# Patient Record
Sex: Male | Born: 1985
Health system: Southern US, Community
[De-identification: ages and names within clinical notes are randomized; demographics above are authoritative.]

## PROBLEM LIST (undated history)

## (undated) DIAGNOSIS — Q256 Stenosis of pulmonary artery: Secondary | ICD-10-CM

## (undated) DIAGNOSIS — Z9289 Personal history of other medical treatment: Secondary | ICD-10-CM

## (undated) DIAGNOSIS — F840 Autistic disorder: Secondary | ICD-10-CM

## (undated) DIAGNOSIS — F79 Unspecified intellectual disabilities: Secondary | ICD-10-CM

## (undated) DIAGNOSIS — Q211 Atrial septal defect, unspecified: Secondary | ICD-10-CM

## (undated) HISTORY — PX: CARDIAC SURGERY: SHX584

## (undated) HISTORY — DX: Personal history of other medical treatment: Z92.89

## (undated) HISTORY — PX: TONSILLECTOMY: SUR1361

---
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
## 2007-01-02 DIAGNOSIS — L309 Dermatitis, unspecified: Secondary | ICD-10-CM

## 2007-01-02 DIAGNOSIS — H409 Unspecified glaucoma: Secondary | ICD-10-CM | POA: Insufficient documentation

## 2007-01-02 DIAGNOSIS — L301 Dyshidrosis [pompholyx]: Secondary | ICD-10-CM | POA: Insufficient documentation

## 2007-01-31 ENCOUNTER — Ambulatory Visit (HOSPITAL_BASED_OUTPATIENT_CLINIC_OR_DEPARTMENT_OTHER): Admission: RE | Admit: 2007-01-31 | Discharge: 2007-01-31 | Payer: Self-pay | Admitting: Ophthalmology

## 2007-02-04 ENCOUNTER — Encounter (INDEPENDENT_AMBULATORY_CARE_PROVIDER_SITE_OTHER): Payer: Self-pay | Admitting: *Deleted

## 2007-02-04 DIAGNOSIS — Q225 Ebstein's anomaly: Secondary | ICD-10-CM

## 2007-02-12 ENCOUNTER — Encounter (INDEPENDENT_AMBULATORY_CARE_PROVIDER_SITE_OTHER): Payer: Self-pay | Admitting: *Deleted

## 2007-05-15 ENCOUNTER — Telehealth: Payer: Self-pay | Admitting: *Deleted

## 2007-05-16 ENCOUNTER — Ambulatory Visit (HOSPITAL_BASED_OUTPATIENT_CLINIC_OR_DEPARTMENT_OTHER): Admission: RE | Admit: 2007-05-16 | Discharge: 2007-05-16 | Payer: Self-pay | Admitting: Ophthalmology

## 2007-06-12 ENCOUNTER — Telehealth (INDEPENDENT_AMBULATORY_CARE_PROVIDER_SITE_OTHER): Payer: Self-pay | Admitting: *Deleted

## 2007-06-12 ENCOUNTER — Encounter: Admission: RE | Admit: 2007-06-12 | Discharge: 2007-06-12 | Payer: Self-pay | Admitting: Sports Medicine

## 2007-06-12 ENCOUNTER — Ambulatory Visit: Payer: Self-pay | Admitting: Family Medicine

## 2007-07-09 ENCOUNTER — Ambulatory Visit: Payer: Self-pay | Admitting: Family Medicine

## 2007-07-09 DIAGNOSIS — H5069 Other mechanical strabismus: Secondary | ICD-10-CM

## 2007-07-09 DIAGNOSIS — L708 Other acne: Secondary | ICD-10-CM | POA: Insufficient documentation

## 2007-07-09 DIAGNOSIS — L732 Hidradenitis suppurativa: Secondary | ICD-10-CM | POA: Insufficient documentation

## 2008-03-09 ENCOUNTER — Emergency Department (HOSPITAL_COMMUNITY): Admission: EM | Admit: 2008-03-09 | Discharge: 2008-03-09 | Payer: Self-pay | Admitting: Emergency Medicine

## 2008-04-29 ENCOUNTER — Ambulatory Visit: Payer: Self-pay | Admitting: Family Medicine

## 2008-04-29 ENCOUNTER — Telehealth: Payer: Self-pay | Admitting: *Deleted

## 2008-07-26 ENCOUNTER — Emergency Department (HOSPITAL_COMMUNITY): Admission: EM | Admit: 2008-07-26 | Discharge: 2008-07-26 | Payer: Self-pay | Admitting: Family Medicine

## 2008-07-26 ENCOUNTER — Telehealth: Payer: Self-pay | Admitting: *Deleted

## 2008-07-28 ENCOUNTER — Ambulatory Visit: Payer: Self-pay | Admitting: Family Medicine

## 2008-09-21 ENCOUNTER — Encounter: Payer: Self-pay | Admitting: *Deleted

## 2008-10-11 ENCOUNTER — Ambulatory Visit: Payer: Self-pay | Admitting: Family Medicine

## 2009-07-15 ENCOUNTER — Ambulatory Visit: Payer: Self-pay | Admitting: Family Medicine

## 2009-07-15 DIAGNOSIS — F840 Autistic disorder: Secondary | ICD-10-CM | POA: Insufficient documentation

## 2009-07-21 ENCOUNTER — Encounter: Payer: Self-pay | Admitting: Family Medicine

## 2010-07-07 ENCOUNTER — Ambulatory Visit: Payer: Self-pay | Admitting: Cardiovascular Disease

## 2010-07-07 ENCOUNTER — Ambulatory Visit (HOSPITAL_COMMUNITY): Admission: RE | Admit: 2010-07-07 | Discharge: 2010-07-07 | Payer: Self-pay | Admitting: Cardiovascular Disease

## 2010-09-15 ENCOUNTER — Encounter: Payer: Self-pay | Admitting: Family Medicine

## 2010-12-05 NOTE — Miscellaneous (Signed)
   Clinical Lists Changes  Problems: Removed problem of ONYCHOMYCOSIS, TOENAILS (ICD-110.1) Removed problem of SKIN RASH (ICD-782.1) Removed problem of PULMONARY EDEMA ACUTE NOS (ICD-518.4) Removed problem of ACNE (ICD-706.1)

## 2010-12-07 ENCOUNTER — Encounter: Payer: Self-pay | Admitting: Family Medicine

## 2010-12-07 ENCOUNTER — Emergency Department (HOSPITAL_COMMUNITY): Payer: Medicaid Other

## 2010-12-07 ENCOUNTER — Ambulatory Visit (INDEPENDENT_AMBULATORY_CARE_PROVIDER_SITE_OTHER): Payer: Medicaid Other | Admitting: Family Medicine

## 2010-12-07 ENCOUNTER — Emergency Department (HOSPITAL_COMMUNITY)
Admission: EM | Admit: 2010-12-07 | Discharge: 2010-12-08 | Disposition: A | Discharge status: Nursing Home (Includes ICF) | Payer: Medicaid Other | Source: Home / Self Care | Attending: Emergency Medicine | Admitting: Emergency Medicine

## 2010-12-07 DIAGNOSIS — R112 Nausea with vomiting, unspecified: Secondary | ICD-10-CM | POA: Insufficient documentation

## 2010-12-07 DIAGNOSIS — R1110 Vomiting, unspecified: Secondary | ICD-10-CM

## 2010-12-07 DIAGNOSIS — N390 Urinary tract infection, site not specified: Secondary | ICD-10-CM

## 2010-12-07 DIAGNOSIS — F840 Autistic disorder: Secondary | ICD-10-CM

## 2010-12-07 DIAGNOSIS — R319 Hematuria, unspecified: Secondary | ICD-10-CM | POA: Insufficient documentation

## 2010-12-07 LAB — CONVERTED CEMR LAB
Glucose, Urine, Semiquant: NEGATIVE
Nitrite: POSITIVE
Protein, U semiquant: 30
Urobilinogen, UA: 2
pH: 6.5

## 2010-12-07 LAB — URINALYSIS, ROUTINE W REFLEX MICROSCOPIC
Hgb urine dipstick: NEGATIVE
Ketones, ur: 40 mg/dL — AB
Protein, ur: NEGATIVE mg/dL
Urine Glucose, Fasting: NEGATIVE mg/dL

## 2010-12-07 LAB — COMPREHENSIVE METABOLIC PANEL
ALT: 16 U/L (ref 0–53)
Albumin: 4.1 g/dL (ref 3.5–5.2)
Alkaline Phosphatase: 59 U/L (ref 39–117)
Potassium: 4 mEq/L (ref 3.5–5.1)
Sodium: 136 mEq/L (ref 135–145)
Total Protein: 7.7 g/dL (ref 6.0–8.3)

## 2010-12-07 LAB — CBC
HCT: 44 % (ref 39.0–52.0)
MCHC: 34.3 g/dL (ref 30.0–36.0)
MCV: 81.2 fL (ref 78.0–100.0)
RDW: 14 % (ref 11.5–15.5)

## 2010-12-07 LAB — DIFFERENTIAL
Basophils Absolute: 0 10*3/uL (ref 0.0–0.1)
Basophils Relative: 0 % (ref 0–1)
Eosinophils Absolute: 0 10*3/uL (ref 0.0–0.7)
Eosinophils Relative: 0 % (ref 0–5)
Lymphocytes Relative: 6 % — ABNORMAL LOW (ref 12–46)
Lymphs Abs: 0.6 10*3/uL — ABNORMAL LOW (ref 0.7–4.0)
Monocytes Absolute: 0.8 10*3/uL (ref 0.1–1.0)
Monocytes Relative: 7 % (ref 3–12)
Neutro Abs: 9.3 10*3/uL — ABNORMAL HIGH (ref 1.7–7.7)
Neutrophils Relative %: 87 % — ABNORMAL HIGH (ref 43–77)

## 2010-12-07 LAB — PROTIME-INR: INR: 1.35 (ref 0.00–1.49)

## 2010-12-07 LAB — ABO/RH: ABO/RH(D): O POS

## 2010-12-07 LAB — TYPE AND SCREEN

## 2010-12-07 MED ORDER — IOHEXOL 300 MG/ML  SOLN
100.0000 mL | Freq: Once | INTRAMUSCULAR | Status: AC | PRN
  Administered 2010-12-07: 100 mL via INTRAVENOUS

## 2010-12-08 ENCOUNTER — Observation Stay (HOSPITAL_COMMUNITY)
Admission: EM | Admit: 2010-12-08 | Discharge: 2010-12-09 | Disposition: A | Discharge status: Home / Self Care | Payer: Medicaid Other | Source: Other Acute Inpatient Hospital | Attending: Family Medicine | Admitting: Family Medicine

## 2010-12-08 ENCOUNTER — Encounter: Payer: Self-pay | Admitting: Family Medicine

## 2010-12-08 DIAGNOSIS — F840 Autistic disorder: Secondary | ICD-10-CM | POA: Insufficient documentation

## 2010-12-08 DIAGNOSIS — N390 Urinary tract infection, site not specified: Principal | ICD-10-CM | POA: Insufficient documentation

## 2010-12-08 DIAGNOSIS — E860 Dehydration: Secondary | ICD-10-CM | POA: Insufficient documentation

## 2010-12-08 DIAGNOSIS — Z23 Encounter for immunization: Secondary | ICD-10-CM | POA: Insufficient documentation

## 2010-12-08 DIAGNOSIS — R109 Unspecified abdominal pain: Secondary | ICD-10-CM | POA: Insufficient documentation

## 2010-12-08 DIAGNOSIS — I379 Nonrheumatic pulmonary valve disorder, unspecified: Secondary | ICD-10-CM | POA: Insufficient documentation

## 2010-12-08 DIAGNOSIS — Q225 Ebstein's anomaly: Secondary | ICD-10-CM | POA: Insufficient documentation

## 2010-12-08 DIAGNOSIS — K920 Hematemesis: Secondary | ICD-10-CM | POA: Insufficient documentation

## 2010-12-08 DIAGNOSIS — R339 Retention of urine, unspecified: Secondary | ICD-10-CM | POA: Insufficient documentation

## 2010-12-08 LAB — CBC
HCT: 44 % (ref 39.0–52.0)
Hemoglobin: 14.6 g/dL (ref 13.0–17.0)
MCH: 26.8 pg (ref 26.0–34.0)
MCHC: 33.2 g/dL (ref 30.0–36.0)
MCV: 80.7 fL (ref 78.0–100.0)
Platelets: 180 10*3/uL (ref 150–400)
RBC: 5.45 MIL/uL (ref 4.22–5.81)
RDW: 14.2 % (ref 11.5–15.5)
WBC: 7.3 10*3/uL (ref 4.0–10.5)

## 2010-12-08 LAB — BASIC METABOLIC PANEL
BUN: 9 mg/dL (ref 6–23)
CO2: 27 mEq/L (ref 19–32)
Calcium: 9 mg/dL (ref 8.4–10.5)
Chloride: 103 mEq/L (ref 96–112)
Creatinine, Ser: 1.17 mg/dL (ref 0.4–1.5)
GFR calc Af Amer: >60 mL/min (ref >60)
GFR calc non Af Amer: >60 mL/min (ref >60)
Glucose, Bld: 87 mg/dL (ref 70–99)
Potassium: 3.9 mEq/L (ref 3.5–5.1)
Sodium: 140 mEq/L (ref 135–145)

## 2010-12-08 LAB — BILIRUBIN, TOTAL: Total Bilirubin: 1.8 mg/dL — ABNORMAL HIGH (ref 0.3–1.2)

## 2010-12-10 LAB — URINE CULTURE: Culture  Setup Time: 201202030512

## 2010-12-11 ENCOUNTER — Telehealth (INDEPENDENT_AMBULATORY_CARE_PROVIDER_SITE_OTHER): Payer: Self-pay | Admitting: *Deleted

## 2010-12-13 ENCOUNTER — Telehealth: Payer: Self-pay | Admitting: Family Medicine

## 2010-12-13 NOTE — Assessment & Plan Note (Signed)
Summary: UTI, N/V and Diarrhea   Vital Signs:  Patient profile:   25 year old male Height:      70 inches Weight:      221 pounds BMI:     31.82 Temp:     97.9 degrees F oral Pulse rate:   96 / minute BP sitting:   128 / 77  (left arm) Cuff size:   regular  Vitals Entered By: Tessie Fass CMA (December 07, 2010 11:29 AM) CC: abdominal pain, nausea, vomiting and diarrhea x this am Pain Assessment Patient in pain? yes     Location: abdomen   Primary Care Provider:  Angelena Sole MD  CC:  abdominal pain, nausea, and vomiting and diarrhea x this am.  History of Present Illness: Nausea vomiting and abd pain 25 y/o autistic male who comes in with his mother. He has been having some abdominal pain, diarrhea and vomiting since 9:30 am today. He has some chills but no fever. His mom says he was crying with pain this morning which is unusual for him. He says pain is in his head and abdomen. He says he had blood coming from his penis when he urinated yesterday. He had not told his mom this before the visit.   Allergies (verified): No Known Drug Allergies  Review of Systems        vitals reviewed and pertinent negatives and positives seen in HPI   Physical Exam  General:  autistic but alert and well-developed, well-nourished, and well-hydrated.   Head:  Normocephalic and atraumatic without obvious abnormalities. No apparent alopecia or balding. Abdomen:  pt has tenderness in his epigastric region.  Genitalia:  pt has dried blood at the opening of the penis. no rahses or lesions.  Psych:  interactive but anxious and worried that he was going to get a shot.    Impression & Recommendations:  Problem # 1:  UTI (ICD-599.0) Assessment New Pt has a UTI based on UA. He has never had a UTI in the past per mom. He saw blood in his urine today. Plan to treat with Bactrim.   His updated medication list for this problem includes:    Bactrim Ds 800-160 Mg Tabs  (Sulfamethoxazole-trimethoprim) .Marland Kitchen... Take 1 pill every 12 hours for the next 7 days.  Orders: [facility]- Est Level  3 (46962)  Problem # 2:  HEMATURIA UNSPECIFIED (ICD-599.70) Assessment: New Due to the UTI as noted above.   His updated medication list for this problem includes:    Bactrim Ds 800-160 Mg Tabs (Sulfamethoxazole-trimethoprim) .Marland Kitchen... Take 1 pill every 12 hours for the next 7 days.  Orders: Urinalysis-[facility] (00000) [facility]- Est Level  3 (95284)  Problem # 3:  NAUSEA AND VOMITING (ICD-787.01) Assessment: New Pt has N/V/D and was given Rx for Zofran to help with the vomiting should it return. It may be all related to his UTI but pt's mom given precautions about bringing him back or going to ED if he worsens.   Orders: [facility]- Est Level  3 (13244)  Complete Medication List: 1)  Bactrim Ds 800-160 Mg Tabs (Sulfamethoxazole-trimethoprim) .... Take 1 pill every 12 hours for the next 7 days. 2)  Zofran Odt 4 Mg Tbdp (Ondansetron) .... Take 1 tablet every 4-6 hours as needed for nauea  Patient Instructions: 1)  You have a UTI.  2)  STart taking the antibiotics today.  3)  Sip on Gatorade all day today.  4)  I am sending in a nausea medicine.  Let it dissolve under the tongue when you feel nasueated 5)  If you have worsening diarrhea or vomiting please go to the ER before you get dehydrated.  Prescriptions: ZOFRAN ODT 4 MG TBDP (ONDANSETRON) take 1 tablet every 4-6 hours as needed for nauea  #6 x 0   Entered and Authorized by:   Jamie Brookes MD   Signed by:   Jamie Brookes MD on 12/07/2010   Method used:   Electronically to        [facility]  [address] [id number] ([facility])       [address]       [place], [place]  [address]       Ph: [phone] or [phone]       Fax: [phone]   RxID:   [id number] BACTRIM DS 800-160 MG TABS (SULFAMETHOXAZOLE-TRIMETHOPRIM) take 1 pill every 12 hours for the next 7 days.  #14 x 0   Entered and Authorized by:   Jamie Brookes  MD   Signed by:   Jamie Brookes MD on 12/07/2010   Method used:   [facility] to        [facility]  [address] [id number]       [address]       [place], [place]  [address]       Ph: [phone] or [phone]       Fax: [phone]   RxID:   [id number]    Orders Added: 1)  Urinalysis-[facility] [00000] 2)  [facility]- Est Level  3 [99213]    Laboratory Results   Urine Tests  Date/Time Received: December 07, 2010 11:49 AM  Date/Time Reported: December 07, 2010 12:08 PM   Routine Urinalysis   Color: yellow Appearance: Cloudy Glucose: negative   (Normal Range: Negative) Bilirubin: negative   (Normal Range: Negative) Ketone: large (80)   (Normal Range: Negative) Spec. Gravity: 1.015   (Normal Range: 1.003-1.035) Blood: large   (Normal Range: Negative) pH: 6.5   (Normal Range: 5.0-8.0) Protein: 30   (Normal Range: Negative) Urobilinogen: 2.0   (Normal Range: 0-1) Nitrite: positive   (Normal Range: Negative) Leukocyte Esterace: moderate   (Normal Range: Negative)  Urine Microscopic WBC/HPF: loaded RBC/HPF: loaded Bacteria/HPF: 3+ Epithelial/HPF: 1-5 Casts/LPF: occ granular    Comments: ...............test performed by......Marland KitchenBonnie A. [place], MLS (ASCP)cm

## 2010-12-13 NOTE — Initial Assessments (Signed)
Summary: Hemetemesis, Abd pain       Allergies Added: NKDA Clinical Lists Changes  Observations: Added new observation of EXTREMITIES: No clubbing, cyanosis, edema, or deformity noted with normal full range of motion of all joints.   (12/08/2010 2:56) Added new observation of PEDAL PULSE: R and L carotid,radial,femoral,dorsalis pedis and posterior tibial pulses are full and equal bilaterally (12/08/2010 2:56) Added new observation of ABDOMEN EXAM: Bowel sounds positive,abdomen soft and non-tender without masses, organomegaly or hernias noted. (12/08/2010 2:56) Added new observation of LUNG EXAM: Normal respiratory effort, chest expands symmetrically. Lungs are clear to auscultation, no crackles or wheezes. (12/08/2010 2:56) Added new observation of HEART EXAM: Regular rate and rhythm. 2/6 SEM heard best at LLSB.  No rub or gallop.  2+ Dorsalis Pedis pulses.  (12/08/2010 2:56) Added new observation of ORAL EXAM: MMM (12/08/2010 2:56) Added new observation of EYE EXAM: EOMI  (12/08/2010 2:56) Added new observation of HD/FACE INSP: normocephalic and atraumatic.   (12/08/2010 2:56) Added new observation of ROS: GU: Complains of hematuria; Denies dysuria, urinary frequency (12/08/2010 2:56) Added new observation of ROS: GI: Complains of abdominal pain, diarrhea, nausea, vomiting, vomiting blood (12/08/2010 2:56) Added new observation of ZOX:WRUEAV: Denies cough, shortness of breath (12/08/2010 2:56) Added new observation of WUJ:WJXBJYN: Complains of chills, fever (12/08/2010 2:56) Added new observation of PE COMMENTS: 1.  CBC 10.7> 15.1/44.0 < 186, ANC 9.3 2.  Cmet Na 136, K 4.0, Cl 100, CO2 27, CGluc 86, Bun 12, Cr 1.18, TB 2.0, Alp 59, AST 20, ALT 16, TP 7.7, Alb 4.1, Ca 9.3  3.  UA spec grav >1.046, 40 ketones, Nitrite neg, Leuk neg 4.  CT abd/pelvis: Negative.  No acute findings or other significant abnormality. 5.  UA from [facility] 11/06/10  Color: yellow Appearance: Cloudy Glucose: negative    (Normal Range: Negative) Bilirubin: negative   (Normal Range: Negative) Ketone: large (80)   (Normal Range: Negative) Spec. Gravity: 1.015   (Normal Range: 1.003-1.035) Blood: large   (Normal Range: Negative) pH: 6.5   (Normal Range: 5.0-8.0) Protein: 30   (Normal Range: Negative) Urobilinogen: 2.0   (Normal Range: 0-1) Nitrite: positive   (Normal Range: Negative) Leukocyte Esterace: moderate   (Normal Range: Negative)  Urine Microscopic  WBC/HPF: loaded RBC/HPF: loaded Bacteria/HPF: 3+ (12/08/2010 2:56) Added new observation of MEDRECON: current updated (12/08/2010 2:56) Added new observation of NKA: T (12/08/2010 2:56) Added new observation of ALLERGY REV: Done (12/08/2010 2:56) Added new observation of PEADULT: Mallory Schaad MD ~General`Gen appear ~Additional Exam`PE comments ~Head`hd/face insp ~Eyes`Eye exam ~Mouth`Oral exam ~Heart`Heart exam ~Lungs`lung exam ~Abdomen`Abdomen exam ~Pulses`pedal pulse ~Extremities`Extremities (12/08/2010 2:56) Added new observation of GEN APPEAR: Sleeping, arousable, but falls back asleep. (12/08/2010 2:56) Added new observation of BP DIASTOLIC: 72 mmHg (12/08/2010 8:29) Added new observation of BP SYSTOLIC: 117 mmHg (12/08/2010 2:56) Added new observation of PULSE RATE: 83 /min (12/08/2010 2:56) Added new observation of OXYGEN FLOW: Room air L/min (12/08/2010 2:56) Added new observation of O2SAT(OXIM): 95 % (12/08/2010 2:56) Added new observation of RESP RATE: 20 /min (12/08/2010 2:56) Added new observation of TEMPERATURE: 98.7 deg F (12/08/2010 2:56) Added new observation of HPI: 25 y/o autistic male who was brought to ER by his mother for hematemesis and abdomal pain.  Yesterday he was seen at the [facility] for abd pain, diarrhea and vomiting x1 day and was found to have a UTI that they treated with Bactrim.  Around 4pm yesterday he took the Bactrim and Zofran.  At 5pm he began  to have hematemesis and EMS was called to bring pt to [facility] ER.  When he arrived in  the ED he was febrile with temp 101.3.  He had a urinalysis that was negative for nitrites and leukocytes and also a negative abd/pelvis CT.  I was able to confirm UA from [facility] with EDP and pt was subsequently given Rocephin 1gram IV  and ibuprofen.  He also received a total of 1L NS in the ED.  Pt was then transferred to [facility] for admission.   Mom reports that pt has not urinated sine 3pm on 12/07/10.  He was not able to void in the ED and the urine specimen was obtained by catheter.  She states that he still complains of abd pain, but that he has been able to fall asleep.  Pt has also not vomitted since being in the ED.  Diarrhea has improved and pt has not had a BM since earlier yesterday.    (12/08/2010 2:56) Added new observation of CHIEF CMPLNT: Hematemesis, Abd pain  (12/08/2010 2:56) Added new observation of PRIMARY MD: Angelena Sole MD  (12/08/2010 2:56)      Primary Care Provider:  Angelena Sole MD  CC:  Hematemesis and Abd pain.  History of Present Illness: 25 y/o autistic male who was brought to ER by his mother for hematemesis and abdomal pain.  Yesterday he was seen at the [facility] for abd pain, diarrhea and vomiting x1 day and was found to have a UTI that they treated with Bactrim.  Around 4pm yesterday he took the Bactrim and Zofran.  At 5pm he began to have hematemesis and EMS was called to bring pt to [facility] ER.  When he arrived in the ED he was febrile with temp 101.3.  He had a urinalysis that was negative for nitrites and leukocytes and also a negative abd/pelvis CT.  I was able to confirm UA from [facility] with EDP and pt was subsequently given Rocephin 1gram IV  and ibuprofen.  He also received a total of 1L NS in the ED.  Pt was then transferred to [facility] for admission.   Mom reports that pt has not urinated sine 3pm on 12/07/10.  He was not able to void in the ED and the urine specimen was obtained by catheter.  She states that he still complains of abd pain, but that he has been able to  fall asleep.  Pt has also not vomitted since being in the ED.  Diarrhea has improved and pt has not had a BM since earlier yesterday.     Past History:  Past Medical History: Last updated: 07/09/2007 ? 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 Reyes Ivan (cardiologist)  Past Surgical History: Last updated: 07/09/2007 Strabismus surgery in 3/08 and 5/08  Family History: Last updated: 01/02/2007 MGF with Alzheimer`s in 2s., Mother and father in good health., No CAD, DM, or cancers.  Social History: Last updated: 07/09/2007 Looking to go to [employer].  Does well in school, high functioning autism.  No Etoh, drugs, tobacco.  Denies sexual intercourse. Likes typing class and movies and music.  Plans to write his own movie.  Very interested in girls.   Review of Systems General:  Complains of chills and fever. Resp:  Denies cough and shortness of breath. GI:  Complains of abdominal pain, diarrhea, nausea, vomiting, and vomiting blood. GU:  Complains of hematuria; denies dysuria and urinary  frequency.   Vital Signs:  Patient profile:   25 year old male O2 Sat:      95 % on Room air Temp:     98.7 degrees F Pulse rate:   83 / minute Resp:     20 per minute BP sitting:   117 / 72  O2 Flow:  Room air   Physical Exam  General:  Sleeping, arousable, but falls back asleep. Head:  normocephalic and atraumatic.   Eyes:  EOMI  Mouth:  MMM Lungs:  Normal respiratory effort, chest expands symmetrically. Lungs are clear to auscultation, no crackles or wheezes. Heart:  Regular rate and rhythm. 2/6 SEM heard best at LLSB.  No rub or gallop.  2+ Dorsalis Pedis pulses.  Abdomen:  Bowel sounds positive,abdomen soft and non-tender without masses, organomegaly or hernias noted. Pulses:  R and L carotid,radial,femoral,dorsalis pedis and posterior tibial pulses are full and equal bilaterally Extremities:  No  clubbing, cyanosis, edema, or deformity noted with normal full range of motion of all joints.   Additional Exam:  1.  CBC 10.7> 15.1/44.0 < 186, ANC 9.3 2.  Cmet Na 136, K 4.0, Cl 100, CO2 27, CGluc 86, Bun 12, Cr 1.18, TB 2.0, Alp 59, AST 20, ALT 16, TP 7.7, Alb 4.1, Ca 9.3  3.  UA spec grav >1.046, 40 ketones, Nitrite neg, Leuk neg 4.  CT abd/pelvis: Negative.  No acute findings or other significant abnormality. 5.  UA from [facility] 11/06/10  Color: yellow Appearance: Cloudy Glucose: negative   (Normal Range: Negative) Bilirubin: negative   (Normal Range: Negative) Ketone: large (80)   (Normal Range: Negative) Spec. Gravity: 1.015   (Normal Range: 1.003-1.035) Blood: large   (Normal Range: Negative) pH: 6.5   (Normal Range: 5.0-8.0) Protein: 30   (Normal Range: Negative) Urobilinogen: 2.0   (Normal Range: 0-1) Nitrite: positive   (Normal Range: Negative) Leukocyte Esterace: moderate   (Normal Range: Negative)  Urine Microscopic  WBC/HPF: loaded RBC/HPF: loaded Bacteria/HPF: 3+   Impression & Recommendations:  Problem # 1:  Pyelonephritis Pt presented with fever, abd pain, vomiting with earlier diagnosis of UTI.  Pt was Rx Bactrim yesterday and was brought to ER after hemetemesis at home.  He was given Rocephin x 1 in ED.  He has not been proven to fail outpatient treatment, but given that he was very dehydrated on UA from [facility] with spec grav of >1.046, he would benefit from continued IVF and IV antibiotic.  Pt is currently sleeping at this time so I cannot assess whether he can tolerate by mouth yet.  We will let him have a regular diet in AM as we continue with fluid hydration.  When pt can tolerate by mouth, we will transition back to Bactrim to finish the course for Pyelo treatment.   Problem # 2:  Urinary retention Resolved.  During this exam pt was able to void without difficuly.  Mom did not notice hematuria.   Problem # 3:  Hematemesis Hb stable.  Likely from aggravation of  earlier vomiting.  Will continue to monitor.   Problem # 4:  Valvular Pulmonary Stenosis Mom is requesting that [facility], pt's cardiologist, be notified that pt is hospitalized.  She is not asking for a formal consultation, but states that in case they feel they need to see him.    Problem # 5:  Prophylaxis SCDs  Problem # 6:  FEN/GI Regular diet.  NS @ 125 cc/hr  Problem # 7:  Disposition Clinical improvement.  Likely later today.   Complete Medication List: 1)  Bactrim Ds 800-160 Mg Tabs (Sulfamethoxazole-trimethoprim) .... Take 1 pill every 12 hours for the next 7 days. 2)  Zofran Odt 4 Mg Tbdp (Ondansetron) .... Take 1 tablet every 4-6 hours as needed for nauea    Current Medications (verified): 1)  Bactrim Ds 800-160 Mg Tabs (Sulfamethoxazole-Trimethoprim) .... Take 1 Pill Every 12 Hours For The Next 7 Days. 2)  Zofran Odt 4 Mg Tbdp (Ondansetron) .... Take 1 Tablet Every 4-6 Hours As Needed For Nauea  Allergies (verified): No Known Drug Allergies

## 2010-12-13 NOTE — Telephone Encounter (Addendum)
Message copied by Loralee Pacas on Wed Dec 13, 2010  5:41 PM ------      Message from: [facility], [place]      Created: Wed Dec 13, 2010  3:29 PM      Regarding: Levoquin for patient       I recently discharged this patient from the hospital and there have been some problems with him getting his levoquin.  I believe he has gotten it but am getting confusing messages.  If you could possibly call and verify this that would be wonderful!  I have tried and so far am getting an answering machine.            Thanks!  Hope you all are surviving!  I called and spoke with pt's mom he did get the meds

## 2010-12-18 NOTE — H&P (Signed)
NAME:  [name], [name] NO.:  [id number]  MEDICAL RECORD NO.:  [id number]           PATIENT TYPE:  LOCATION:                                 FACILITY:  PHYSICIAN:  Leighton Roach Azlaan Isidore, M.D.DATE OF BIRTH:  [birth year]  DATE OF ADMISSION:  12/08/2010 DATE OF DISCHARGE:                             HISTORY & PHYSICAL   PRIMARY CARE PHYSICIAN:  Angelena Sole, MD, at the [facility].  CHIEF COMPLAINT:  Hematemesis and abdominal pain.  HISTORY OF PRESENT ILLNESS:  This is a 25 year old autistic male who was brought to the ER by his mother by way of EMS for hematemesis and abdominal pain.  Yesterday on December 07, 2010, the patient was seen at the [facility] for abdominal pain, diarrhea, and vomiting x1 day.  The patient was found to have a urinary tract infection and was treated with Bactrim.  Around 4 p.m. yesterday, the patient to the Bactrim and Zofran.  At 5 p.m., he began to have vomiting that mom saw with blood present and EMS was called to bring patient to the ER.  The patient arrived at the [facility] ER and was found to be febrile with a temperature of 101.3.  He had a urinalysis at [facility] that was negative for nitrites and leukocyte, but and also a negative CT of the abdomen and pelvis.  I was able to confirm the urinalysis from the [facility] with the EDP in [place], and patient was subsequently given Rocephin IV x1 and ibuprofen for fever.  He also received a total of 1 liter of normal saline in the ED.  The patient was then transferred to [facility] for admission.  Mom reports that patient has not urinated since 3 p.m. on December 07, 2010.  He was not able to void in the ED and the urine specimen was obtained  by catheter.  She states that he still complains of abdominal pain, but that he has been able to fall asleep.  The patient has also not vomited since being in the ER.  Diarrhea has improved  and patient has not had a bowel movement since yesterday.  PAST MEDICAL HISTORY: 1. Questionable early glaucoma. 2. Autism. 3. Chromosome 8P depletion. 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  PAST SURGICAL HISTORY:  Strabismus surgery in March of 2008 and May of 2008.  CURRENT MEDICATIONS: 1. Bactrim DS 800/180 one tab every 12 hours x7 days, the patient took     one dose. 2. Zofran 4 mg every 4-6 hours as needed for nausea, the patient took     one dose.  ALLERGIES:  No known drug allergies.  FAMILY HISTORY: 1. Maternal grandfather with Alzheimer's in his 34s. 2. Mother and father in good health. 3. No coronary artery disease, diabetes, or cancer.  SOCIAL HISTORY:  No alcohol, drugs, or tobacco.  He is looking to go to [employer] for school.  REVIEW OF SYSTEMS:  GENERAL:  Complains of fevers and chills. RESPIRATORY:  Denies cough and shortness of breath.  GI:  Complains of abdominal pain, diarrhea, nausea, vomiting, and  vomiting blood.  GU: Complains of hematuria.  Denies dysuria or urinary frequency.  PHYSICAL EXAMINATION:  VITAL SIGNS:  Temp 98.7, pulse 83, respiratory rate 20, blood pressure 117/72, O2 sat 95% on room air. GENERAL:  Sleeping, arousable, but falls back asleep. HEAD:  Normocephalic, atraumatic. EYES:  Extraocular motility intact. MOUTH:  Moist mucous membranes. LUNGS:  Clear to auscultation bilaterally.  No wheezing, rales, or rhonchi. HEART:  Regular rate and rhythm.  A 2/6 SEM heard best at left lower left sternal border.  No rubs or gallops. ABDOMEN:  Bowel sounds positive.  Abdomen is soft and nontender without masses, organomegaly, or hernias noted.  Pulses +2 dorsalis pedis, +2 radial bilaterally. EXTREMITIES:  No clubbing, cyanosis, or edema.  STUDIES AND LABORATORY DATA: 1. CBC with white blood cell 10.7, hemoglobin 15.1, hematocrit 44.0,     platelet 186, ANC 9.3. 2. A CMET with sodium 136, potassium 4.0, chloride 100, CO2 of  27,     glucose 86, BUN 12, creatinine 1.18, total bilirubin 2.0, alkaline     phosphatase 59, AST 20, ALT 16, total protein 7.7, albumin 4.1,     calcium 9.3. 3. Urinalysis with specific gravity more than 1.046, 40 ketones,     nitrite negative, leukocyte negative. 4. CT abdomen and pelvis was negative.  No acute findings or other     significant abnormality. 5. Urinalysis from [facility] from December 07, 2010, that showed 80 ketones, large blood, positive for nitrites,     positive for leukocyte, white blood cell loaded, red blood cell     loaded, bacteria +2.  ASSESSMENT AND PLAN:  This is a 25 year old male with abdominal pain, hematuria, fever likely pyelonephritis. 1. Pyelonephritis.  The patient presented with fever, abdominal pain,     vomiting with earlier diagnosis of urinary tract infection.  The     patient was given a prescription with Bactrim yesterday and was     brought to the ER after hematemesis at home.  He was given Rocephin     x1 in the ED.  He has not been proven to fail outpatient treatment,     but given that he is very dehydrated on urinalysis and with a creatinine of 1.18, he would benefit from continue IV fluid     hydration and IV antibiotics.  The patient had some vomiting     earlier yesterday when he arrived to the emergency room.  At this     time, we will continue on IV fluid and assess for his ability to     have p.o. intake in the morning.  The patient is currently     sleeping, so I cannot assess whether he tolerates p.o. at this     time.  We will let him have a regular diet in the morning and we     will continue with fluid hydration.  The patient can tolerate p.o.     We will transition him back to the Bactrim that he was given     earlier and he can continue to finish up this course of 7 days.     Given the presentation of abdominal pain and hematuria, a     differential diagnosis could be renal calculi.  This is less  likely     given that his urinalysis from the [facility] show     positive for nitrites and leukocyte esterase.  The patient also  presented with fever. 2. Urinary retention, this has resolved.  During this exam, the     patient was able to void without difficulty. 3. Hematemesis.  Hemoglobin is stable.  Now, this is likely aggravated     from vomiting earlier yesterday.  We will continue to monitor. 4. Valvular pulmonary stenosis.  Mom is requesting that [facility]     [facility] who is patient's cardiologist been notified that the     patient is hospitalized.  She is not asking for formal consult, but     states that she would like him to know the patient is in the     hospital in case they think they should see him. 5. Prophylaxis.  Bilateral sequential compression devices. 6. FEN/GI:  Regular diet.  Normal saline at 125 mL an hour. 7. Disposition:  Clinical improvement.     Angeline Slim, MD   ______________________________ Etta Grandchild, M.D.    CT/MEDQ  D:  12/08/2010  T:  12/08/2010  Job:  [id number]  Electronically Signed by CAT TA MD on 12/17/2010 03:57:57 PM Electronically Signed by Acquanetta Belling M.D. on 12/18/2010 07:35:40 AM

## 2010-12-19 ENCOUNTER — Ambulatory Visit: Payer: Medicaid Other | Admitting: Family Medicine

## 2010-12-21 NOTE — Progress Notes (Signed)
Summary: Rx requiring pre-auth   Phone Note Call from Patient Call back at Home Phone [phone]   Reason for Call: Talk to Nurse Summary of Call: pt released from hospital sat & discharged home with a new abx, pharmacy says rx requires pre-auth. pt given levofloxacin 250 mgs. [facility]. pt cannot take the abx given last thu, it made him very sick.  Initial call taken by: Knox Royalty,  December 11, 2010 9:27 AM  Follow-up for Phone Call        message  to patient left to return call. Follow-up by: Theresia Lo RN,  December 11, 2010 11:26 AM  Additional Follow-up for Phone Call Additional follow up Details #1::        paged Dr. Louanne Belton and advised him that levofloxacin required PA. he states there is no preferred drug that will work in place of this .  he states he may not be able to get here today to fill out form but he will alert the team that this needs to be done . form placed in Dr. Kathrynn Humble box. Additional Follow-up by: Theresia Lo RN,  December 11, 2010 11:41 AM    Additional Follow-up for Phone Call Additional follow up Details #2::    PA form  has been completed  by Dr. Louanne Belton and form faxed. Theresia Lo RN  December 11, 2010 3:33 PM  Follow-up by: Theresia Lo RN,  December 11, 2010 3:34 PM  spoke with mother and advised that PA has been started.  she states patient has been without antibiotic since Sat . advised hopefully we will hear something tomorrow about the PA. suggested if possible she pay out of pocket for a few tabs to get him thru a day until we hear. she voices understanding. Theresia Lo RN  December 11, 2010 3:40 PM   received PA from  [facility] for levofloxacin. pharmacy notified. mother notified.  Theresia Lo RN  December 12, 2010 9:34 AM

## 2011-01-01 NOTE — Discharge Summary (Signed)
NAME:  [name], [name] NO.:  [id number]  MEDICAL RECORD NO.:  [id number]           PATIENT TYPE:  E  LOCATION:  WLED                         FACILITY:  [facility]  PHYSICIAN:  Paula Compton, MD        DATE OF BIRTH:  [birth year]  DATE OF ADMISSION:  12/07/2010 DATE OF DISCHARGE:  12/08/2010                              DISCHARGE SUMMARY   PRIMARY CARE PROVIDER:  Angelena Sole, MD  DISCHARGE DIAGNOSES: 1. Urinary tract infection. 2. Upper gastrointestinal bleed secondary to forceful vomiting. 3. Autism. 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  DISCHARGE MEDICATIONS: 1. Levaquin 750 mg by mouth daily for 5 days. 2. Zofran 4 mg by mouth every 4 hours as needed for nausea.  The     patient was instructed to stop the following medications, double     strength Bactrim.  CONSULTS:  None.  PROCEDURES:  None.  IMAGING:  CT scan of the abdomen and pelvis with contrast obtained December 07, 2010, demonstrated no acute findings or significant abnormalities.  LABORATORY DATA:  Labs on the date of admission demonstrated a white count of 10.7, hemoglobin of 15.1, platelets of 186.  CMET was remarkable only for a bilirubin of 2.0.  Urinalysis demonstrated a specific gravity of 1.046, pH of 7, 40 ketones, 2 urobilinogen, negative nitrite, negative leukocytes.  Urine culture was 45,000 gram-negative rods preliminary.  CBC obtained on December 08, 2010, demonstrated a white count of 7.3 and was otherwise unremarkable.  Basic metabolic obtained on the same date was unremarkable.  Total bilirubin obtained on the same day was 1.8.  Urine GC/Chlamydia was negative.  BRIEF HOSPITAL COURSE:  This is a 25 year old male with autism presented to the [facility] Emergency Department several hours following a clinic appointment, in which she was diagnosed with a UTI.  The patient was given a prescription for double strength Bactrim at [facility].  He  took one tablet and within an hour vomited.  The vomitus was noted to be bloody.  Accordingly went to the emergency department where he was a febrile, but he was admitted for observation considering the fact that he was having some abdominal pain and had vomited blood.  He was started on IV Zosyn at that time.  From the time of admission onwards, the patient remained afebrile, did not vomit anymore, and tolerated p.o. well.  When it was determined that the patient's hemoglobin was stable and that he was remaining afebrile. He was switched to p.o. Levaquin.  It was decided not to continue with the Bactrim as it is likely that this contributed to the patient's vomiting.  The patient tolerated this well and the following morning when he remained afebrile, he was discharged.  DISCHARGE INSTRUCTIONS:  The patient was discharged with instructions to have no activity restrictions, no dietary restrictions.  Followup appointments with Dr. Lelon Perla on December 19, 2010.  ISSUES FOR FOLLOWUP: 1. It will be important to be certain that the patient's urinary     symptoms have resolved.  Followup of the culture to assure that     sensitivities are  appropriate as recommended. 2. The patient was noted to have a mildly elevated total bilirubin     while in the hospital.  It is recommended to repeat regimen as an     outpatient.  Further workup may be required.  DISCHARGE CONDITION:  The patient was discharged home in stable medical condition.    ______________________________ Majel Homer, MD   ______________________________ Paula Compton, MD    ER/MEDQ  D:  12/10/2010  T:  12/10/2010  Job:  [id number]  cc:   Angelena Sole, MD  Electronically Signed by Manuela Neptune MD on 12/27/2010 10:48:30 AM Electronically Signed by Paula Compton MD on 01/01/2011 09:46:47 AM

## 2011-03-20 NOTE — Op Note (Signed)
[name], [name]              ACCOUNT NO.:  [id number]   MEDICAL RECORD NO.:  [id number]          PATIENT TYPE:  AMB   LOCATION:  DSC                          FACILITY:  [facility]   PHYSICIAN:  Pasty Spillers. Maple Hudson, M.D. DATE OF BIRTH:  1986-02-05   DATE OF PROCEDURE:  05/16/2007  DATE OF DISCHARGE:  05/16/2007                               OPERATIVE REPORT   PREOPERATIVE DIAGNOSES:  1. Residual exotropia.  2. Developmental delay.   POSTOPERATIVE DIAGNOSES:  1. Residual exotropia.  2. Developmental delay.   PROCEDURES PERFORMED:  1. Left medial rectus muscle resection, 8.0 mm.  2. Right lateral rectus muscle rerecession, 6.0 mm (previously      recessed 10.0 mm).  3. Right medial rectus muscle reresection, 5.0 mm (previously resected      7.0 mm).   SURGEON:  Verne Carrow, M.D.   ANESTHESIA:  General (laryngeal mask).   COMPLICATIONS:  None.   DESCRIPTION OF PROCEDURE:  After routine prep evaluation including  informed consent from the mother, the patient was taken to the operating  room where he was identified by me.  General anesthesia was induced  without difficulty.  After placement of appropriate monitors the patient  was prepped and draped in a standard sterile fashion.  A lid speculum  was placed in the left eye.   Through an inferonasal fornix incision, deep in the fornix, immediately  adjacent to the plica semilunaris, the left medial rectus muscle was  engaged on a series of muscle hooks and carefully cleared of its fascial  attachments to approximately 20 mm posterior to the insertion.  The  muscle was spread between two self-retaining hooks.  A 2 mm bite was  taken of the center of the muscle belly and it measured a distance of  8.0 mm posterior to the insertion.  A knot was tied securely at this  location.  The needle at each end of the double-armed 6-0 Vicryl suture  was passed from the center of the muscle belly to the periphery,  parallel to and 8.0 mm  posterior to the insertion.  A double locking  bite was placed at each border of the muscle.  A resection clamp was  placed on the muscle just anterior to these sutures.  The muscle was  disinserted.  It was reattached to sclera by passing each pole suture  posteriorly to anteriorly through the corresponding end of the muscle  stump, then anteriorly to posteriorly near the center of the stump, then  posteriorly to anteriorly through the center of the muscle belly, just  posterior to the previously placed knot.  The muscle was drawn up to the  level of the original insertion, and all slack was removed before the  suture, and the suture ends were tied securely.  The clamp was removed.  The portion of the muscle anterior to the sutures was carefully excised.  The conjunctiva was closed with two 6-0 Vicryl sutures.  Through an  inferotemporal fornix incision through conjunctiva and Tenon's fascia  (the same incision used for the original recession), the lateral rectus  muscle was identified  and engaged on a series of muscle hooks and  carefully cleared of its extensive surrounding fascial attachments and  scar tissue.  Care was taken to separate the muscle from the immediately  adjacent inferior oblique muscle.  The tendon was secured with a double-  armed 6-0 Vicryl suture, with a double-locking bite at each border of  the muscle.  The muscle was disinserted and was reattached and the  sclera at a measured distance of 6.0 mm posterior to the current  insertion, meaning a 16 mm posterior to the original insertion.  The  reattachment was made using direct scleral passes in crossed swords  fashion.  The suture ends were tied securely.  The conjunctiva was  closed with multiple 6-0 Vicryl sutures.   Through an inferonasal nasal fornix incision through conjunctiva and  Tenon's fascia, deep in the fornix, immediately adjacent to the plica  semilunaris, the right medial rectus muscle was engaged on  a series of  muscle hooks and cleared of its fascial attachments and scar tissue to  at least 15 mm posterior to the insertion.  It was not possible to clear  tissue further posteriorly than this.  The muscle was spread between two  self-retaining hooks.  A 2 mm bite was taken of the center of muscle at  a measured distance of 5.0 mm posterior to the current insertion.  Note  that the current insertion appeared to be at about 7 mm posterior to the  limbus instead of 5.5 mm.  A knot was tied securely at the level of 5 mm  posterior to the current insertion.  The needle at each end of the  double-armed suture was passed from the center of the muscle, down to  the periphery, parallel to and 5.0 mm posterior to the current  insertion, and a double-locked bite was placed at the border of the  muscle.  A resection clamp was placed on the muscle just anterior to  these sutures.  The muscle was disinserted.  Each pole suture was passed  posteriorly to anteriorly through the corresponding end of the original  muscle stump (5.5 mm posterior to the limbus), and anteriorly to  posteriorly near the center of the stump, then posteriorly to anteriorly  through the center of the muscle belly, just posterior to the previously  placed knot.  The muscle was drawn up to the level of the original  insertion, and all slack was removed before the suture ends were tied  securely.  The conjunctiva was closed with two 6-0 Vicryl sutures.  TobraDex ointment was placed in each eye.   The patient was awakened without difficulty and taken to the recovery in  stable condition, having suffered no intraoperative or immediate  postoperative complications.      Pasty Spillers. Maple Hudson, M.D.  Electronically Signed     WOY/MEDQ  D:  05/16/2007  T:  05/18/2007  Job:  [id number]   cc:   Pasty Spillers. Maple Hudson, M.D.

## 2011-03-23 NOTE — Op Note (Signed)
NAME:  [name], [name]              ACCOUNT NO.:  [id number]   MEDICAL RECORD NO.:  [id number]          PATIENT TYPE:  AMB   LOCATION:  DSC                          FACILITY:  [facility]   PHYSICIAN:  Pasty Spillers. Maple Hudson, M.D. DATE OF BIRTH:  1986-10-19   DATE OF PROCEDURE:  01/31/2007  DATE OF DISCHARGE:                               OPERATIVE REPORT   PREOPERATIVE DIAGNOSES:  1. Exotropia.  2. Developmental delay.   POSTOPERATIVE DIAGNOSES:  1. Exotropia.  2. Developmental delay.   PROCEDURE:  1. Right lateral rectus muscle recession, 10 mm.  2. Right medial rectus muscle resection, 7 mm.  3. Left lateral rectus muscle recession, 12 mm.   SURGEON:  Pasty Spillers. Maple Hudson, M.D.   ANESTHESIA:  General (laryngeal mask).   COMPLICATIONS:  None.   DESCRIPTION OF PROCEDURE:  After routine preop evaluation including  informed consent from the parents, the patient was taken to the  operating room, where he was identified by me.  General anesthesia was  induced without difficulty after placement of appropriate monitors.  The  patient prepped and draped in standard sterile fashion.  A lid speculum  placed in the right eye.   Through an inferotemporal fornix incision through conjunctiva and  Tenon's fascia, the right lateral rectus muscle was engaged on a series  of muscle hooks and cleared of its fascial attachments.  The tendon was  secured with a double-arm 6-0 Vicryl suture, with a double-locking bite  at each border of the muscle, 1 mm from the insertion.  The muscle was  disinserted, and was reattached to sclera at a measured distance of 10  mm posterior to the original insertion, using direct scleral passes in  crossed-swords fashion.  The sutures were tied securely after the  position of the muscle had been checked and found to be accurate.  The  conjunctiva was closed with two 6-0 Vicryl sutures.  A fornix incision  was made inferotemporally and inferonasal in the right eye with  Westcott  scissors, immediately temporal to the plica semilunaris, deep in the  fornix.  Through this incision, the right medial rectus muscle was  engaged on a series of muscle hooks and cleared of its fascial  attachments.  The muscle was spread between 2 self-retaining hooks.  A 2-  mm bite was taken of the center of the muscle belly to a measured  distance of 7 mm posterior to the insertion, and a knot was tied  securely at this location.  The needle at each end of the double-arm  suture was passed from the center of the muscle belly to the periphery,  7 mm posterior to and parallel to the insertion.  A double-locking bite  was placed at each border of the muscle.  A resection clamp was placed  on the muscle just anterior to these sutures.  The muscle was  disinserted.  Each pole suture was passed posteriorly to anteriorly near  the corresponding end of the muscle stump, then anteriorly to  posteriorly near the center of the stump, then posteriorly to anteriorly  through the center of the  muscle belly, just posterior to the previously  placed knot.  The muscle was drawn up to the level of the original  insertion, and all slack was removed before the suture ends were tied  securely.  Conjunctiva was closed with two 6-0 Vicryl sutures.  The  speculums was transferred to the left eye, where the lateral rectus  muscle was recessed just as described for the right lateral rectus  muscle, except that the left lateral rectus was recessed 12 mm instead  of 10 mm.  Conjunctiva was closed with two 6-0 Vicryl sutures.  TobraDex  ointment was placed in each eye.  The patient was awakened without  difficulty and taken to the recovery room in stable condition, having  suffered no intraoperative or immediate postop complications.      Pasty Spillers. Maple Hudson, M.D.  Electronically Signed     WOY/MEDQ  D:  01/31/2007  T:  01/31/2007  Job:  [id number]

## 2011-06-01 ENCOUNTER — Ambulatory Visit (INDEPENDENT_AMBULATORY_CARE_PROVIDER_SITE_OTHER): Payer: Medicaid Other | Admitting: Family Medicine

## 2011-06-01 VITALS — BP 128/84 | HR 77 | Temp 97.8°F | Ht 74.0 in | Wt 208.8 lb

## 2011-06-01 DIAGNOSIS — H60399 Other infective otitis externa, unspecified ear: Secondary | ICD-10-CM

## 2011-06-01 DIAGNOSIS — H6090 Unspecified otitis externa, unspecified ear: Secondary | ICD-10-CM | POA: Insufficient documentation

## 2011-06-01 MED ORDER — CIPROFLOXACIN-HYDROCORTISONE 0.2-1 % OT SUSP: 3.0000 [drp] | Freq: Two times a day (BID) | OTIC | Status: DC

## 2011-06-01 MED ORDER — OFLOXACIN 0.3 % OT SOLN: 5.0000 [drp] | Freq: Two times a day (BID) | OTIC | Status: AC

## 2011-06-01 NOTE — Assessment & Plan Note (Signed)
Cerumen impactions removed with irrigation, Cipro otic for 5 days.

## 2011-06-01 NOTE — Patient Instructions (Signed)
No Q-tips Use drops twice daily for 5 days

## 2011-06-01 NOTE — Progress Notes (Signed)
Addended by: Luretha Murphy T on: 06/01/2011 04:49 PM   Modules accepted: Orders

## 2011-06-01 NOTE — Progress Notes (Signed)
  Subjective:    Patient ID: [name], male    DOB: [birth year], 25 y.o.   MRN: [id number]  HPI [name] swimming and feels like there is something in his ears.  Mother thinks that he has been putting Qtips in to clean it out   Review of Systems  Constitutional: Negative for fever.  HENT: Positive for hearing loss and congestion. Negative for sore throat and rhinorrhea.   Respiratory: Negative for cough.        Objective:   Physical Exam  Constitutional:       Developmentally challenged  HENT:  Nose: Nose normal.  Mouth/Throat: Oropharynx is clear and moist.       Bilateral cerumen impactions irrigated out, canals inflammed  Eyes: Conjunctivae are normal.  Pulmonary/Chest: Effort normal and breath sounds normal.  Lymphadenopathy:    He has no cervical adenopathy.          Assessment & Plan:   No problem-specific assessment & plan notes found for this encounter.

## 2011-06-07 ENCOUNTER — Encounter: Payer: Self-pay | Admitting: Family Medicine

## 2011-06-07 ENCOUNTER — Ambulatory Visit (INDEPENDENT_AMBULATORY_CARE_PROVIDER_SITE_OTHER): Payer: Medicaid Other | Admitting: Family Medicine

## 2011-06-07 DIAGNOSIS — Q221 Congenital pulmonary valve stenosis: Secondary | ICD-10-CM

## 2011-06-07 DIAGNOSIS — H60399 Other infective otitis externa, unspecified ear: Secondary | ICD-10-CM

## 2011-06-07 DIAGNOSIS — L219 Seborrheic dermatitis, unspecified: Secondary | ICD-10-CM | POA: Insufficient documentation

## 2011-06-07 DIAGNOSIS — H6090 Unspecified otitis externa, unspecified ear: Secondary | ICD-10-CM

## 2011-06-07 DIAGNOSIS — L2089 Other atopic dermatitis: Secondary | ICD-10-CM

## 2011-06-07 DIAGNOSIS — L708 Other acne: Secondary | ICD-10-CM

## 2011-06-07 HISTORY — DX: Seborrheic dermatitis, unspecified: L21.9

## 2011-06-07 MED ORDER — KETOCONAZOLE 2 % EX CREA: TOPICAL_CREAM | Freq: Every day | CUTANEOUS | Status: DC

## 2011-06-07 MED ORDER — HYDROCORTISONE 1 % EX CREA: TOPICAL_CREAM | CUTANEOUS | Status: DC

## 2011-06-07 NOTE — Patient Instructions (Addendum)
Use ear drops for 6 more days.   Try washing out ears with hydrogen peroxide mixed in warm water a few times a week to prevent ear was build-up.  For flaky scalp, try OTC dandruff shampoo (with 2.5% selenium sulfide).   For face, try hydrocortisone cream and anti-fungal cream 1-2 times daily.  Keep face moisturized with water-based moisturizer.   Follow-up in 1 month.

## 2011-06-07 NOTE — Assessment & Plan Note (Signed)
Currently just on right forearms. Hydrocortisone cream for this.

## 2011-06-07 NOTE — Assessment & Plan Note (Addendum)
This seems to be resolving on left and fully resolved on right. Washed out left ear, removed cerumen. Ear canal still slightly erythematous with some dried blood around TM. Recommended ear drops for another week and washing out ears a few times a week bilateral ears to prevent impaction.

## 2011-06-07 NOTE — Assessment & Plan Note (Signed)
" >>  ASSESSMENT AND PLAN FOR [facility] WRITTEN ON 06/07/2011 10:16 AM BY [place] PARK, Ariyan Brisendine J, MD  Currently just on right forearms. Hydrocortisone  cream for this.  "

## 2011-06-07 NOTE — Assessment & Plan Note (Signed)
Will treat with mild potency steroid and anti-fungal cream. Will follow-up in 1 month. Discouraged using Tone's Cocoa butter for face, but water-based moisturizer.

## 2011-06-07 NOTE — Progress Notes (Signed)
  Subjective:    Patient ID: [name], male    DOB: 1986/01/08, 25 y.o.   MRN: [id number]  HPI 1. Otitis externa Ear pain improving. Mother concerned because brown fluid coming out of left ear. Concerned curette may have scratched inside of his ear. No more decreased hearing.  2. Flaky scalp and skin Past 7 years.  Worse now than before.  Redness and flaky skin around nose and dry patches on arms.   Using Tone's Cocoa Butter for face. Itches. Does not hurt.   Uses Pantene shampoo for hair. Washes once weekly.   Review of Systems    Objective:   Physical Exam General: NAD, autistic Psych: not agitated, cooperative Ear: bloody cerumen on left, partial occlusion of ear canal, difficult to assess TM; right TM clear CV: 3/6 SEM heard best @ upper sternal borders Pulm: CTAB Scalp: white, flaky skin at parts; hair in dreadlocks Face: mild erythema lateral nasal folds and across medial maxillary area with some dry flaky skin near nasal folds Other skin: dry patches of hypopigmented skin on right lateral forearm    Assessment & Plan:

## 2011-10-01 ENCOUNTER — Emergency Department (HOSPITAL_COMMUNITY)
Admission: EM | Admit: 2011-10-01 | Discharge: 2011-10-01 | Disposition: A | Discharge status: Home / Self Care | Payer: Medicaid Other | Attending: Emergency Medicine | Admitting: Emergency Medicine

## 2011-10-01 ENCOUNTER — Emergency Department (HOSPITAL_COMMUNITY): Payer: Medicaid Other

## 2011-10-01 ENCOUNTER — Encounter (HOSPITAL_COMMUNITY): Payer: Self-pay | Admitting: Emergency Medicine

## 2011-10-01 DIAGNOSIS — R509 Fever, unspecified: Secondary | ICD-10-CM | POA: Insufficient documentation

## 2011-10-01 DIAGNOSIS — R5381 Other malaise: Secondary | ICD-10-CM | POA: Insufficient documentation

## 2011-10-01 DIAGNOSIS — J069 Acute upper respiratory infection, unspecified: Secondary | ICD-10-CM | POA: Insufficient documentation

## 2011-10-01 DIAGNOSIS — F840 Autistic disorder: Secondary | ICD-10-CM | POA: Insufficient documentation

## 2011-10-01 DIAGNOSIS — R5383 Other fatigue: Secondary | ICD-10-CM | POA: Insufficient documentation

## 2011-10-01 HISTORY — DX: Stenosis of pulmonary artery: Q25.6

## 2011-10-01 HISTORY — DX: Autistic disorder: F84.0

## 2011-10-01 HISTORY — DX: Unspecified intellectual disabilities: F79

## 2011-10-01 LAB — DIFFERENTIAL
Lymphocytes Relative: 6 % — ABNORMAL LOW (ref 12–46)
Lymphs Abs: 0.5 10*3/uL — ABNORMAL LOW (ref 0.7–4.0)
Monocytes Relative: 11 % (ref 3–12)
Neutro Abs: 6.8 10*3/uL (ref 1.7–7.7)
Neutrophils Relative %: 81 % — ABNORMAL HIGH (ref 43–77)

## 2011-10-01 LAB — CBC
Hemoglobin: 15 g/dL (ref 13.0–17.0)
Platelets: 184 10*3/uL (ref 150–400)
RBC: 5.46 MIL/uL (ref 4.22–5.81)
WBC: 8.4 10*3/uL (ref 4.0–10.5)

## 2011-10-01 MED ORDER — OXYMETAZOLINE HCL 0.05 % NA SOLN: 2.0000 | Freq: Two times a day (BID) | NASAL | Status: AC

## 2011-10-01 MED ORDER — ALBUTEROL SULFATE HFA 108 (90 BASE) MCG/ACT IN AERS: 2.0000 | INHALATION_SPRAY | RESPIRATORY_TRACT | Status: DC | PRN

## 2011-10-01 NOTE — ED Provider Notes (Signed)
History     CSN: [id number] Arrival date & time: 10/01/2011  2:57 PM   First MD Initiated Contact with Patient 10/01/11 1524      Chief Complaint  Patient presents with  . Fever    Fever and cough x 18 hrs    (Consider location/radiation/quality/duration/timing/severity/associated sxs/prior treatment) Patient is a 25 y.o. male presenting with URI. The history is provided by a parent.  URI The primary symptoms include fever, fatigue and cough. Primary symptoms do not include headaches, ear pain, sore throat, swollen glands, abdominal pain, nausea, vomiting, myalgias or rash. The current episode started yesterday. This is a new problem. The problem has not changed since onset. Symptoms associated with the illness include congestion and rhinorrhea.  Pt has hx of autism, MR. Hx obtained from Mom. He apparently had the onset of URI sx yesterday and has seemed to have some malaise all day today; per mom, at the facility where he stays during the day, he was running a slight fever to 100.3. He has not mentioned any shortness of breath to her but has been coughing. No nausea, vomiting, complaints of abd pain.  Past Medical History  Diagnosis Date  . UTI (urinary tract infection), uncomplicated 12/2010    Tx c Bactrim  . Pulmonary artery stenosis, main   . Autism disorder   . Mental retardation     Past Surgical History  Procedure Date  . Tonsillectomy     No family history on file.  History  Substance Use Topics  . Smoking status: Never Smoker   . Smokeless tobacco: Never Used  . Alcohol Use: No      Review of Systems  Constitutional: Positive for fever, appetite change and fatigue.  HENT: Positive for congestion and rhinorrhea. Negative for ear pain, sore throat, trouble swallowing, neck pain, neck stiffness, voice change and ear discharge.   Respiratory: Positive for cough.   Cardiovascular: Negative for chest pain.  Gastrointestinal: Negative for nausea, vomiting,  abdominal pain and diarrhea.  Genitourinary: Negative for decreased urine volume.  Musculoskeletal: Negative for myalgias.  Skin: Negative for color change and rash.  Neurological: Negative for headaches.  Psychiatric/Behavioral: Negative for behavioral problems and confusion.    Allergies  Sulfa antibiotics  Home Medications   Current Outpatient Rx  Name Route Sig Dispense Refill  . HYDROCORTISONE 1 % EX CREA Topical Apply 1 application topically 2 (two) times daily.      Marland Kitchen KETOCONAZOLE 2 % EX CREA Topical Apply 1 application topically daily. For face and arm.       BP 124/61  Pulse 100  Temp(Src) 98.7 F (37.1 C) (Oral)  Resp 18  SpO2 99%  Physical Exam  Nursing note and vitals reviewed. Constitutional: He is oriented to person, place, and time. He appears well-developed and well-nourished. No distress.       Pt appears to not feel well, but is nontoxic appearing/not lethargic and cooperative with exam.  HENT:  Head: Normocephalic and atraumatic.  Right Ear: External ear normal.  Left Ear: External ear normal.  Mouth/Throat: Oropharynx is clear and moist. No oropharyngeal exudate.       TMs clear; no oropharyngeal erythema  Eyes: Conjunctivae and EOM are normal. Pupils are equal, round, and reactive to light.  Neck: Normal range of motion. Neck supple.       Negative meningeal signs.  Cardiovascular: Normal rate and regular rhythm.   Pulmonary/Chest: Effort normal and breath sounds normal. He has no wheezes.  Abdominal: Soft.  Bowel sounds are normal. There is no tenderness. There is no rebound and no guarding.       CVA tenderness neg.  Musculoskeletal: Normal range of motion.  Lymphadenopathy:    He has no cervical adenopathy.  Neurological: He is alert and oriented to person, place, and time. No cranial nerve deficit.  Skin: Skin is warm and dry. He is not diaphoretic.    ED Course  Procedures (including critical care time)  Labs Reviewed  DIFFERENTIAL -  Abnormal; Notable for the following:    Neutrophils Relative 81 (*)    Lymphocytes Relative 6 (*)    Lymphs Abs 0.5 (*)    All other components within normal limits  CBC  LAB REPORT - SCANNED   Dg Chest 2 View  10/01/2011  *RADIOLOGY REPORT*  Clinical Data: Cough.  Fever.  Weakness.  CHEST - 2 VIEW  Comparison: 12/15/2003  Findings: The heart is normal in size.  There are no focal consolidations or pleural effusions.  No edema. Visualized osseous structures have a normal appearance.  IMPRESSION: Negative exam.  Original Report Authenticated By: Patterson Hammersmith, M.D.     1. Upper respiratory infection       MDM  Pt nontoxic appearing. Imaging negative for PNA or other worrisome infectious pathology. WBC not elevated. Suspect that this is most likely URI given pt's cough/rhinorrhea. Pt and mom were instructed to make f/u with pt's PCP later this week for a recheck; we discussed worrisome sx that would prompt return visit to the ED. Mom verbalized understanding and agreed to plan.        Grant Fontana, [place] 10/02/11 1110

## 2011-10-01 NOTE — ED Notes (Signed)
Pt is a special needs adult. Mother stated that he had a fever this am

## 2011-10-01 NOTE — ED Notes (Signed)
Mother reports 18 hr hx of fever, chills., poor appetite

## 2011-10-02 NOTE — ED Provider Notes (Signed)
Medical screening examination/treatment/procedure(s) were performed by non-physician practitioner and as supervising physician I was immediately available for consultation/collaboration.  Nolah Krenzer, MD 10/02/11 1336 

## 2011-10-03 ENCOUNTER — Ambulatory Visit (INDEPENDENT_AMBULATORY_CARE_PROVIDER_SITE_OTHER): Payer: Medicaid Other | Admitting: Family Medicine

## 2011-10-03 ENCOUNTER — Encounter: Payer: Self-pay | Admitting: Family Medicine

## 2011-10-03 VITALS — BP 126/74 | HR 95 | Temp 99.6°F | Ht 74.0 in | Wt 207.8 lb

## 2011-10-03 DIAGNOSIS — A499 Bacterial infection, unspecified: Secondary | ICD-10-CM

## 2011-10-03 DIAGNOSIS — B9689 Other specified bacterial agents as the cause of diseases classified elsewhere: Secondary | ICD-10-CM | POA: Insufficient documentation

## 2011-10-03 DIAGNOSIS — J329 Chronic sinusitis, unspecified: Secondary | ICD-10-CM

## 2011-10-03 MED ORDER — AMOXICILLIN-POT CLAVULANATE 875-125 MG PO TABS: 1.0000 | ORAL_TABLET | Freq: Two times a day (BID) | ORAL | Status: AC

## 2011-10-03 NOTE — Assessment & Plan Note (Addendum)
Acute bacterial sinusitis. No clinical signs of lower resp tract involvement. Will treat with augmentin x 10 days. Discussed red flags for return. Will follow prn. Handout given.

## 2011-10-03 NOTE — Progress Notes (Signed)
  Subjective:    Patient ID: [name], male    DOB: 28-Nov-1985, 25 y.o.   MRN: [id number]  HPI Viral URI symptoms x1 week. Patient has a baseline history of mild to moderate MR. Patient is noted to have severe nasal congestion and cough as well as fever that was noted on Monday. Patient was seen in ED. A chest x-ray obtained was negative. Patient was prescribed nasal Afrin as well as albuterol. Mom states that symptoms have persisted. Patient had a Tmax  of 101 overnight. Major symptoms have included nasal congestion purulent nasal drainage postnasal drip mild cough. Patient is a nonsmoker.   Review of Systems See HPI, otherwise 12 point ROS negative.     Objective:   Physical Exam Gen: generally ill appearing, HEENT: generalized erythema over maxillary area, + Maxillary tenderness L>R, + nasal turbinate erythema, + nasal purulent discharge, + post oropharyngeal erythema, no cervical LAD, TMs mildly bulging bilaterally  CV: RRR, no murmurs PULM: CTAB, no wheezes, or decreased breath sounds    Assessment & Plan:

## 2011-10-03 NOTE — Patient Instructions (Signed)

## 2011-10-05 ENCOUNTER — Ambulatory Visit: Payer: Medicaid Other | Admitting: Family Medicine

## 2011-10-05 ENCOUNTER — Telehealth: Payer: Self-pay | Admitting: Family Medicine

## 2011-10-05 NOTE — Telephone Encounter (Signed)
Was here 2 days ago, pt is still very congested and has a fever now.  Not sure what to do

## 2011-10-05 NOTE — Telephone Encounter (Signed)
Called to follow up mom. Mom states the patient is feeling subjectively better however sinus drainage seems to have gone to the chest area. Mom states that because of patient's MR he has a hard time remembering to cough. Mom states it also hurts for him to cough. Most recent MAXIMUM TEMPERATURE over last 24 hours was 99.7. No increased WOB.  Instructed mom to encourage patient to cough every hour to clear secretions. Instructed mom to use ibuprofen as this should help with pleuritic pain. Discussed red flags for a reevaluation. Mom appreciable.

## 2011-10-05 NOTE — Telephone Encounter (Signed)
Mother reports started antibiotic day before yesterday.still is very congested in chest and has fever . This AM  At 6:00 AM was 100.1 now 99.7. Advised to give Tylenol , push fluids. States he is not his usual self, very tired, appetite and drinking liquids not as good as usual.. Will forward message to MD.

## 2011-10-10 ENCOUNTER — Ambulatory Visit: Payer: Medicaid Other

## 2011-10-10 ENCOUNTER — Encounter: Payer: Self-pay | Admitting: Family Medicine

## 2011-10-10 ENCOUNTER — Ambulatory Visit (INDEPENDENT_AMBULATORY_CARE_PROVIDER_SITE_OTHER): Payer: Medicaid Other | Admitting: Family Medicine

## 2011-10-10 VITALS — BP 113/76 | HR 72 | Temp 97.9°F | Ht 74.0 in | Wt 206.0 lb

## 2011-10-10 DIAGNOSIS — J329 Chronic sinusitis, unspecified: Secondary | ICD-10-CM | POA: Insufficient documentation

## 2011-10-10 MED ORDER — CETIRIZINE HCL 10 MG PO TABS: 10.0000 mg | ORAL_TABLET | Freq: Every day | ORAL | Status: DC

## 2011-10-10 MED ORDER — FLUTICASONE PROPIONATE 50 MCG/ACT NA SUSP: 2.0000 | Freq: Every day | NASAL | Status: DC

## 2011-10-10 NOTE — Progress Notes (Signed)
  Subjective:    Patient ID: [name], male    DOB: [birth year], 25 y.o.   MRN: [id number]  HPI Patient is here for reevaluation of sinusitis. Patient was noted to have a bacterial sinusitis at last clinical visit on 10/03/11. Patient was placed on Augmentin for this. Today mom states that patient's sinus symptoms are much improved but she has noticed persistence of cough. Mom feels that sinus drainage is seeming to worsen cough. No fevers, increased work of breathing, nausea or vomiting. No wheezing. Patient has approximately 3 or 4 days left of his antibiotic. Appetite is improving. Mom states the patient has a history of allergic symptoms prior to this recent episode.   Review of Systems See HPI, otherwise 12 point ROS negative.     Objective:   Physical Exam Gen: in bed, NAD HEENT: TMs clear bilaterally, + nasal erythema and turbinate swelling, no maxillary tenderness-improved from prior visit. + post oropharyngeal erythema PULM: good overall air mov't, no wheezing, decreased breath sounds  CV: RRR, no murmurs     Assessment & Plan:

## 2011-10-10 NOTE — Assessment & Plan Note (Signed)
This is overall improving. No red flags for bacterial resistance. Will place pt on flonase and zyrtec to help with allergic/chronic sinusits component of sxs. No current red flags for significant lower resp involvement. Discussed red flags at length with mom. Mom agreeable to plan.

## 2011-10-10 NOTE — Patient Instructions (Signed)
Allergic Rhinitis Allergic rhinitis is when the mucous membranes in the nose respond to allergens. Allergens are particles in the air that cause your body to have an allergic reaction. This causes you to release allergic antibodies. Through a chain of events, these eventually cause you to release histamine into the blood stream (hence the use of antihistamines). Although meant to be protective to the body, it is this release that causes your discomfort, such as frequent sneezing, congestion and an itchy runny nose.  CAUSES  The pollen allergens may come from grasses, trees, and weeds. This is seasonal allergic rhinitis, or "hay fever." Other allergens cause year-round allergic rhinitis (perennial allergic rhinitis) such as house dust mite allergen, pet dander and mold spores.  SYMPTOMS   Nasal stuffiness (congestion).   Runny, itchy nose with sneezing and tearing of the eyes.   There is often an itching of the mouth, eyes and ears.  It cannot be cured, but it can be controlled with medications. DIAGNOSIS  If you are unable to determine the offending allergen, skin or blood testing may find it. TREATMENT   Avoid the allergen.   Medications and allergy shots (immunotherapy) can help.   Hay fever may often be treated with antihistamines in pill or nasal spray forms. Antihistamines block the effects of histamine. There are over-the-counter medicines that may help with nasal congestion and swelling around the eyes. Check with your caregiver before taking or giving this medicine.  If the treatment above does not work, there are many new medications your caregiver can prescribe. Stronger medications may be used if initial measures are ineffective. Desensitizing injections can be used if medications and avoidance fails. Desensitization is when a patient is given ongoing shots until the body becomes less sensitive to the allergen. Make sure you follow up with your caregiver if problems continue. SEEK  MEDICAL CARE IF:   You develop fever (more than 100.5 F (38.1 C).   You develop a cough that does not stop easily (persistent).   You have shortness of breath.   You start wheezing.   Symptoms interfere with normal daily activities.  Document Released: 07/17/2001 Document Revised: 07/04/2011 Document Reviewed: 01/26/2009 [employer] Patient Information 2012 [employer]. 

## 2011-10-24 ENCOUNTER — Ambulatory Visit: Payer: Medicaid Other

## 2012-02-28 ENCOUNTER — Ambulatory Visit (INDEPENDENT_AMBULATORY_CARE_PROVIDER_SITE_OTHER): Payer: Medicaid Other | Admitting: Family Medicine

## 2012-02-28 ENCOUNTER — Encounter: Payer: Self-pay | Admitting: Family Medicine

## 2012-02-28 VITALS — BP 122/82 | HR 88 | Temp 98.5°F | Ht 71.6 in | Wt 209.0 lb

## 2012-02-28 DIAGNOSIS — Q225 Ebstein's anomaly: Secondary | ICD-10-CM

## 2012-02-28 DIAGNOSIS — F840 Autistic disorder: Secondary | ICD-10-CM

## 2012-02-28 DIAGNOSIS — Q999 Chromosomal abnormality, unspecified: Secondary | ICD-10-CM

## 2012-02-28 DIAGNOSIS — Q221 Congenital pulmonary valve stenosis: Secondary | ICD-10-CM

## 2012-02-28 NOTE — Progress Notes (Signed)
  Subjective:    Patient ID: [name], male    DOB: 04-Feb-1986, 26 y.o.   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   The patient will be moving from his home to a group home, [facility] which is owned by his mother. He expressed his desire to move out.   His mother would also like to be referred to a geneticist to learn more about the patient's condition. She has been doing research online regarding his diagnosis and is realizing that more and more people are being diagnosed with this abnormality. She would like to know more about what to expect with this condition and about the patient's prognosis with his associated medical problems (pulmonary stenosis).   He had been followed every 2-3 years by a cardiologist Dr. Clelia Croft who passed away a few years ago. His mother thinks that afterwards, he was being seen by physicians at [facility], but he has not gone for a couple of years after his primary cardiologist moved away.   Review of Systems Denies chest pain, palpitations. Denies difficulty breathing.  Denies changes in behavior (per mother). He is no sexually active but would like to eventually marry and have a family. His mother discusses this with him regularly, encouraging him to remain abstinent until he is married.     Objective:   Physical Exam Gen: NAD Psych: engaged; appropriate to some simple questions but often needs to be re-directed by his mother who gives him the opportunity to answer questions first CV: RRR, 3/6 systolic murmur RUSB Pulm: CTAB without w/r/r Ext: no edema Skin: no lesions    Assessment & Plan:

## 2012-02-28 NOTE — Assessment & Plan Note (Signed)
His mother would like patient to be referred to a geneticist for consultation and discussion about prognosis of patient's condition. He had been seen at [facility] in the past. Will request referral back to them.   His mother provides significant support for him but seems to encourage him to be as independent as appropriately possible. She manages 2 group homes, and one of the group homes is named in honor of him. He will be moving into this group home shortly.

## 2012-02-28 NOTE — Patient Instructions (Addendum)
Good luck [name].   It was nice to meet you and your mom today.   [name]'s mom: if you do not hear from me in 1-2 weeks regarding the genetics referral, please call the clinic and remind me. : )  Follow-up in the fall for your physical.

## 2012-02-28 NOTE — Assessment & Plan Note (Signed)
He had been seeing a cardiologist regularly regarding this. No surgery recommended since stable.  He had not seen a cardiologist for a couple of years now. Will recommend re-evaluation in 1-2 years for follow-up.  He remains asymptomatic.

## 2012-03-06 ENCOUNTER — Other Ambulatory Visit: Payer: Self-pay | Admitting: *Deleted

## 2012-03-06 NOTE — Telephone Encounter (Signed)
Sam calling from [facility] pharmacy to request refill of Nizoral 2% cream---apply BID.  Will route to Dr. Madolyn Frieze.  Gaylene Brooks, RN

## 2012-03-06 NOTE — Telephone Encounter (Signed)
Please see refill info below.  I closed chart in error.  Gaylene Brooks, RN

## 2012-03-10 MED ORDER — KETOCONAZOLE 2 % EX CREA: 1.0000 "application " | TOPICAL_CREAM | Freq: Two times a day (BID) | CUTANEOUS | Status: DC

## 2012-03-10 NOTE — Telephone Encounter (Signed)
OK to send in refill.  Will you please call pharmacy and let them know?

## 2012-03-10 NOTE — Telephone Encounter (Signed)
Addended by: Madolyn Frieze, Marylene Land J on: 03/10/2012 01:15 PM   Modules accepted: Orders

## 2012-03-10 NOTE — Telephone Encounter (Signed)
Called and refilled Nizoral cream.  Pharmacy will dispense #60 gram tube to last for one month.  Gaylene Brooks, RN

## 2012-05-06 ENCOUNTER — Other Ambulatory Visit: Payer: Self-pay | Admitting: *Deleted

## 2012-05-06 MED ORDER — KETOCONAZOLE 2 % EX CREA: 1.0000 "application " | TOPICAL_CREAM | Freq: Two times a day (BID) | CUTANEOUS | Status: DC

## 2012-05-12 MED ORDER — KETOCONAZOLE 2 % EX CREA: 1.0000 "application " | TOPICAL_CREAM | Freq: Two times a day (BID) | CUTANEOUS | Status: DC

## 2012-05-12 NOTE — Telephone Encounter (Signed)
Patient is now using [facility].  Resent Rx for Nizoral cream to them.  Ileana Ladd

## 2012-05-12 NOTE — Addendum Note (Signed)
Addended by: Damita Lack on: 05/12/2012 12:05 PM   Modules accepted: Orders

## 2012-07-10 ENCOUNTER — Other Ambulatory Visit: Payer: Self-pay | Admitting: *Deleted

## 2012-07-10 DIAGNOSIS — J329 Chronic sinusitis, unspecified: Secondary | ICD-10-CM

## 2012-07-11 MED ORDER — FLUTICASONE PROPIONATE 50 MCG/ACT NA SUSP: 2.0000 | Freq: Every day | NASAL | Status: DC

## 2012-08-05 ENCOUNTER — Other Ambulatory Visit: Payer: Self-pay | Admitting: Family Medicine

## 2012-08-18 ENCOUNTER — Ambulatory Visit (INDEPENDENT_AMBULATORY_CARE_PROVIDER_SITE_OTHER): Payer: Self-pay | Admitting: Family Medicine

## 2012-08-18 ENCOUNTER — Encounter: Payer: Self-pay | Admitting: Family Medicine

## 2012-08-18 VITALS — BP 128/73 | HR 75 | Temp 97.7°F | Wt 209.5 lb

## 2012-08-18 DIAGNOSIS — Q255 Atresia of pulmonary artery: Secondary | ICD-10-CM

## 2012-08-18 DIAGNOSIS — J309 Allergic rhinitis, unspecified: Secondary | ICD-10-CM

## 2012-08-18 DIAGNOSIS — Q256 Stenosis of pulmonary artery: Secondary | ICD-10-CM

## 2012-08-18 DIAGNOSIS — Z23 Encounter for immunization: Secondary | ICD-10-CM

## 2012-08-18 NOTE — Patient Instructions (Signed)
[name] is doing well.  Continue with his current seasonal allergy medications daily.   Thanks, Dr. Paulina Fusi

## 2012-08-18 NOTE — Progress Notes (Signed)
[name] is a 26 y.o. here for f/u of his allergic rhinitis.  He is on Flonase 2 sprays qd each nostril and also zyrtec 10 mg.  His seasonal allergies are well controlled and not c/o current cough, rhinorrhea, eye pruritis, tearing from his eyes, sneezing.    Past Medical History  Diagnosis Date  . UTI (urinary tract infection), uncomplicated 12/2010    Tx c Bactrim  . Pulmonary artery stenosis, main   . Autism disorder   . Mental retardation     Social Hx: Never smoker     Current Outpatient Prescriptions on File Prior to Visit  Medication Sig Dispense Refill  . albuterol (PROVENTIL HFA;VENTOLIN HFA) 108 (90 BASE) MCG/ACT inhaler Inhale 2 puffs into the lungs every 4 (four) hours as needed for wheezing.  1 Inhaler  0  . cetirizine (ZYRTEC ALLERGY) 10 MG tablet Take 1 tablet (10 mg total) by mouth daily.  30 tablet  11  . fluticasone (FLONASE) 50 MCG/ACT nasal spray Place 2 sprays into the nose daily. 2 sprays in each nostril daily  16 g  2  . ketoconazole (NIZORAL) 2 % cream APPLY 1 APPLICATION TOPICALLY TWO   (TWO) TIMES DAILY. FOR FACE AND ARM.  30 g  0     Physical Exam Filed Vitals:   08/18/12 1402  BP: 128/73  Pulse: 75  Temp: 97.7 F (36.5 C)    Gen: NAD, Well nourished, Well developed HEENT: PERLA, EOMI, [place]/AT.  No nasal turbinate erythema/edema.  No Postnasal drip.  No periorbital edema or allergic shiners present Neck: no JVD Cardio: RRR, +2/6 Systolic Ejection Murmur at L 2nd IC space Lungs: CTA, no wheezes, rhonchi, crackles Abd: NABS, soft nontender nondistended MSK: ROM normal  Psych: Alert, awake.

## 2012-08-18 NOTE — Assessment & Plan Note (Signed)
Will continue current regiment of Zyrtec 10 mg qd along with Flonase 50 mcg 2 spray each nostril.  Will see back PRN.

## 2012-08-20 ENCOUNTER — Ambulatory Visit (INDEPENDENT_AMBULATORY_CARE_PROVIDER_SITE_OTHER): Payer: Medicaid Other | Admitting: Cardiology

## 2012-08-20 ENCOUNTER — Encounter: Payer: Self-pay | Admitting: *Deleted

## 2012-08-20 VITALS — BP 114/83 | HR 71 | Ht 71.5 in | Wt 206.0 lb

## 2012-08-20 DIAGNOSIS — I370 Nonrheumatic pulmonary valve stenosis: Secondary | ICD-10-CM

## 2012-08-20 DIAGNOSIS — Q211 Atrial septal defect: Secondary | ICD-10-CM | POA: Insufficient documentation

## 2012-08-20 DIAGNOSIS — I379 Nonrheumatic pulmonary valve disorder, unspecified: Secondary | ICD-10-CM

## 2012-08-20 DIAGNOSIS — Q221 Congenital pulmonary valve stenosis: Secondary | ICD-10-CM

## 2012-08-20 NOTE — Patient Instructions (Addendum)
Your physician has requested that you have an echocardiogram. Echocardiography is a painless test that uses sound waves to create images of your heart. It provides your doctor with information about the size and shape of your heart and how well your heart's chambers and valves are working. This procedure takes approximately one hour. There are no restrictions for this procedure.  Your physician wants you to follow-up in: 1 year with Dr Shirlee Latch. (October 2014). You will receive a reminder letter in the mail two months in advance. If you don't receive a letter, please call our office to schedule the follow-up appointment.

## 2012-08-20 NOTE — Progress Notes (Signed)
Patient ID: [name], male   DOB: 04-25-1986, 26 y.o.   MRN: [id number] PCP: Dr. 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  Patient was followed in the past by a pediatric cardiologist then by Dr. Reyes Ivan at [facility].  He has not had cardiology followup for several years now.  Last echo in 2005 showed normal LV EF but questioned noncompaction towards the apex, mild pulmonic stenosis, small secundum ASD, and normal tricuspid valve.  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    Patient is relatively high-functioning for autism.  He reads and writes and has a relatively good memory per his mother.  He has trouble with social interactions.  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  No exertional dyspnea or chest pain.  He participates in the [employer] and skates, bowls, swims, and plays basketball.  No history of syncope or lightheadedness.  No palpitations.    ECG: NSR, normal  PMH: 1. Chromosome 8p deletion syndrome: Autism + cardiac anomalies.  He has pulmonic stenosis and a secundum ASD.  There is note in the chart of Ebstein's anomaly but this was not noted on last echo.  He is followed by a geneticist at [facility].  2. 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  3. Autism with moderate mental retardation.  4. Allergic rhinitis  SH: Living in group home and participating in a day program.  Mother very involved with his care.  Nonsmoker, no drugs.  FH: Mother with heart murmur.   ROS: All systems reviewed and negative except as per HPI.     Current Outpatient Prescriptions  Medication Sig Dispense Refill  . cetirizine (ZYRTEC ALLERGY) 10 MG tablet Take 1 tablet (10 mg total) by mouth daily.  30 tablet  11  . fluticasone (FLONASE) 50 MCG/ACT nasal spray Place 2 sprays into the nose daily. 2 sprays in each nostril daily  16 g  2  . ketoconazole (NIZORAL) 2 % cream APPLY 1 APPLICATION TOPICALLY TWO   (TWO) TIMES DAILY. FOR FACE AND ARM.  30 g  0    BP 114/83  Pulse 71  Ht 5' 11.5" (1.816 m)  Wt 206 lb (93.441 kg)  BMI 28.33 kg/m2 General: NAD Neck: No JVD, no thyromegaly or thyroid nodule.  Lungs: Clear to auscultation bilaterally with normal respiratory effort. CV: Nondisplaced PMI.  Heart regular S1/S2, no S3/S4, fixed split S2, 2/6 early SEM LUSB.  No peripheral edema.  No carotid bruit.  Normal pedal pulses.  Abdomen: Soft, nontender, no hepatosplenomegaly, no distention.  Skin: Intact without lesions or rashes.  Neurologic: Alert and oriented x 3.  Psych: Normal affect. Extremities: No clubbing or cyanosis.  HEENT: Normal.   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  His last echo reports lists mild pulmonic stenosis and a small secundum ASD, both of which can be seen with the 8p deletion syndrome. There was a question of LV noncompaction as well.  The tricuspid valve was described as normal on that particular  echo.  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  On exam, he appears euvolemic.  He has a murmur of PS and a fixed split S2 suggestive of ASD.  He denies exercise intolerance.  I will get an echocardiogram to re-evaluate PS and ASD, will also need to look for evidence of Ebstein's and for LV noncompaction.  Will need to pay close attention to the RV and PA pressure given the ASD.  If I don't get a good view of all areas of concern on echo, I will get a cardiac MRI.  If everything is stable, he will  return in a year.   Marca Ancona 08/20/2012 11:42 AM

## 2012-08-28 ENCOUNTER — Other Ambulatory Visit (HOSPITAL_COMMUNITY): Payer: Medicaid Other

## 2012-08-28 ENCOUNTER — Ambulatory Visit (HOSPITAL_COMMUNITY): Payer: Medicaid Other | Attending: Cardiovascular Disease | Admitting: Radiology

## 2012-08-28 DIAGNOSIS — Q2111 Secundum atrial septal defect: Secondary | ICD-10-CM | POA: Insufficient documentation

## 2012-08-28 DIAGNOSIS — I379 Nonrheumatic pulmonary valve disorder, unspecified: Secondary | ICD-10-CM

## 2012-08-28 DIAGNOSIS — I059 Rheumatic mitral valve disease, unspecified: Secondary | ICD-10-CM | POA: Insufficient documentation

## 2012-08-28 DIAGNOSIS — Q211 Atrial septal defect: Secondary | ICD-10-CM | POA: Insufficient documentation

## 2012-08-28 DIAGNOSIS — R011 Cardiac murmur, unspecified: Secondary | ICD-10-CM | POA: Insufficient documentation

## 2012-08-28 DIAGNOSIS — I370 Nonrheumatic pulmonary valve stenosis: Secondary | ICD-10-CM

## 2012-08-28 DIAGNOSIS — I369 Nonrheumatic tricuspid valve disorder, unspecified: Secondary | ICD-10-CM | POA: Insufficient documentation

## 2012-08-28 NOTE — Progress Notes (Signed)
Echocardiogram performed.  

## 2012-09-08 ENCOUNTER — Other Ambulatory Visit: Payer: Self-pay | Admitting: *Deleted

## 2012-09-08 DIAGNOSIS — I379 Nonrheumatic pulmonary valve disorder, unspecified: Secondary | ICD-10-CM

## 2012-09-10 ENCOUNTER — Encounter: Payer: Self-pay | Admitting: *Deleted

## 2012-09-10 ENCOUNTER — Encounter: Payer: Self-pay | Admitting: Cardiovascular Disease

## 2012-09-16 ENCOUNTER — Encounter: Payer: Self-pay | Admitting: Cardiology

## 2012-09-16 ENCOUNTER — Other Ambulatory Visit: Payer: Self-pay | Admitting: Family Medicine

## 2012-09-16 MED ORDER — KETOCONAZOLE 2 % EX CREA: TOPICAL_CREAM | Freq: Two times a day (BID) | CUTANEOUS | Status: DC

## 2012-09-16 NOTE — Progress Notes (Signed)
Please let pt know Rx has been sent in with two refills.  Thanks, Twana First. Paulina Fusi, DO of [facility] 09/16/2012, 4:46 PM

## 2012-09-29 ENCOUNTER — Telehealth: Payer: Self-pay | Admitting: *Deleted

## 2012-09-29 DIAGNOSIS — Q211 Atrial septal defect: Secondary | ICD-10-CM

## 2012-09-29 DIAGNOSIS — Q221 Congenital pulmonary valve stenosis: Secondary | ICD-10-CM

## 2012-09-29 NOTE — Telephone Encounter (Signed)
Tasha from [facility] MRI calls b/c pt needs BMP prior to MRI tomorrow. Order placed in [place]. Mylo Red RN

## 2012-09-30 ENCOUNTER — Other Ambulatory Visit (HOSPITAL_COMMUNITY): Payer: Medicaid Other

## 2012-09-30 ENCOUNTER — Other Ambulatory Visit: Payer: Self-pay | Admitting: Cardiology

## 2012-09-30 ENCOUNTER — Ambulatory Visit (HOSPITAL_COMMUNITY)
Admission: RE | Admit: 2012-09-30 | Discharge: 2012-09-30 | Disposition: A | Discharge status: Home / Self Care | Payer: Medicaid Other | Source: Ambulatory Visit | Attending: Cardiology | Admitting: Cardiology

## 2012-09-30 DIAGNOSIS — I379 Nonrheumatic pulmonary valve disorder, unspecified: Secondary | ICD-10-CM

## 2012-09-30 DIAGNOSIS — Q221 Congenital pulmonary valve stenosis: Secondary | ICD-10-CM

## 2012-09-30 DIAGNOSIS — Q211 Atrial septal defect: Secondary | ICD-10-CM

## 2012-09-30 LAB — BASIC METABOLIC PANEL
CO2: 31 mEq/L (ref 19–32)
Calcium: 9.9 mg/dL (ref 8.4–10.5)
Creatinine, Ser: 0.96 mg/dL (ref 0.50–1.35)
GFR calc non Af Amer: >90 mL/min (ref >90)
Glucose, Bld: 88 mg/dL (ref 70–99)
Sodium: 141 mEq/L (ref 135–145)

## 2012-09-30 MED ORDER — GADOBENATE DIMEGLUMINE 529 MG/ML IV SOLN
30.0000 mL | Freq: Once | INTRAVENOUS | Status: AC | PRN
  Administered 2012-09-30: 30 mL via INTRAVENOUS

## 2012-10-06 ENCOUNTER — Other Ambulatory Visit: Payer: Self-pay | Admitting: Family Medicine

## 2012-10-07 ENCOUNTER — Other Ambulatory Visit: Payer: Self-pay | Admitting: Family Medicine

## 2012-10-20 ENCOUNTER — Ambulatory Visit (INDEPENDENT_AMBULATORY_CARE_PROVIDER_SITE_OTHER): Payer: Medicaid Other | Admitting: Cardiology

## 2012-10-20 ENCOUNTER — Encounter: Payer: Self-pay | Admitting: *Deleted

## 2012-10-20 ENCOUNTER — Encounter: Payer: Self-pay | Admitting: Cardiology

## 2012-10-20 VITALS — BP 114/68 | HR 80 | Ht 71.5 in | Wt 214.0 lb

## 2012-10-20 DIAGNOSIS — Q221 Congenital pulmonary valve stenosis: Secondary | ICD-10-CM

## 2012-10-20 DIAGNOSIS — Q2111 Secundum atrial septal defect: Secondary | ICD-10-CM

## 2012-10-20 DIAGNOSIS — Q211 Atrial septal defect: Secondary | ICD-10-CM

## 2012-10-20 DIAGNOSIS — I379 Nonrheumatic pulmonary valve disorder, unspecified: Secondary | ICD-10-CM

## 2012-10-20 DIAGNOSIS — Q999 Chromosomal abnormality, unspecified: Secondary | ICD-10-CM

## 2012-10-20 DIAGNOSIS — I370 Nonrheumatic pulmonary valve stenosis: Secondary | ICD-10-CM

## 2012-10-20 NOTE — Progress Notes (Signed)
Patient ID: [name], male   DOB: 06/05/1986, 26 y.o.   MRN: [id number] PCP: Dr. 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  Patient was followed in the past by a pediatric cardiologist then by Dr. Reyes Ivan at [facility].  He has not had cardiology followup for several years now.  Last echo in 2005 showed normal LV EF but questioned noncompaction towards the apex, mild pulmonic stenosis, small secundum ASD, and normal tricuspid valve.  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    Patient is relatively high-functioning for autism.  He reads and writes and has a relatively good memory per his mother.  He has trouble with social interactions.  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  No exertional dyspnea or chest pain.  He participates in the [employer] and skates, bowls, swims, and plays basketball.  No history of syncope or lightheadedness.  No palpitations.    After last appointment, I had him do an echo. This was a difficult study.  LV EF was normal, there appeared to be mild PS with mean gradient 18 mmHg, the main PA was moderately dilated.  The atrial septum and RV were not well-visualized.   PMH: 1. Chromosome 8p deletion syndrome: Autism + cardiac anomalies.  He has pulmonic stenosis and a secundum ASD.  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  He is followed by a geneticist at [facility].  2. Congenital heart disease: Echo in 2005 showed EF 55-60% with ? 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  Echo (10/13) was a difficult study with EF 60-65%, mild Mark,  mild PS peak gradient 18 mmHg, dilated main PA.  Cardiac MRI (10/13) with LV EF 55%, prominent trabeculations LV apex suggestive of noncompaction, minimal PI but suspect mild PS, moderate RV dilation with EF 45% (RV EDVi 155 ml/m2), main PA dilated to 4.6 x 4.4 cm, secundum ASD, hard to tell size on MRI, Qp/Qs by MRI was 1.4/1.   3. Autism with moderate mental retardation.  4. Allergic rhinitis  SH: Living in group home and participating in a day program.  Mother very involved with his care.  Nonsmoker, no drugs.  FH: Mother with heart murmur.   ROS: All systems reviewed and negative except as per HPI.   Current Outpatient Prescriptions  Medication Sig Dispense Refill  . cetirizine (ZYRTEC) 10 MG tablet Take 10 mg by mouth daily.      . fluticasone (FLONASE) 50 MCG/ACT nasal spray INSTILL TWO   SPRAYS IN EACH NOSTRIL DAILY  16 g  6  . GNP HYDROCORTISONE MAX ST 1 % ointment APPLY TO AFFECTED AREA TWICE A DAY (FOR FACE AND ARMS)  30 g  3  . ketoconazole (NIZORAL) 2 % cream Apply topically 2 (two) times daily. To the face and arms  30 g  2    BP 114/68  Pulse 80  Ht 5' 11.5" (1.816 m)  Wt 214 lb (97.07 kg)  BMI 29.43 kg/m2 General: NAD Neck: No JVD, no thyromegaly or thyroid nodule.  Lungs:  Clear to auscultation bilaterally with normal respiratory effort. CV: Nondisplaced PMI.  Heart regular S1/S2, no S3/S4, fixed split S2, 2/6 early SEM LUSB.  No peripheral edema.  No carotid bruit.  Normal pedal pulses.  Abdomen: Soft, nontender, no hepatosplenomegaly, no distention.  Skin: Intact without lesions or rashes.  Neurologic: Alert and oriented x 3.  Psych: Normal affect. Extremities: No clubbing or cyanosis.  HEENT: Normal.   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  On exam, he appears euvolemic.  He has a murmur of PS and a fixed split S2 suggestive of ASD.  He denies exercise intolerance.  TTE and cardiac MRI have been done.   The echo was difficult but suggests mild PS and minimal PI.  The MRI showed moderate RV dilation with RV EF 45% (low normal for RV).  There did not appear to be significant PI.  There did appear to be a secundum ASD.  The main PA was dilated to 4.6 cm.   The enlarged RV (RV EDVi 155 ml/m2)  is not explained by pulmonic stenosis (degree of stenosis appears to be mild, regardless).  I question more severe pulmonic insufficiency than we have observed so far (but seems unlikely based on MRI and TTE) versus significant shunting across the secundum ASD.  The dilated PA can be seen with significant PS, but as above, do not think PS is more than mild based on our studies so far.  High flow from significant ASD could explain PA dilation.  I think that [name] warrants a TEE for closer evaluation of the ASD as it is possible this could require closure.  The dilated PA also is going to need close followup.  I am going to arrange a TEE later this week, will need anesthesia for sedation.   Marca Ancona 10/20/2012 10:04 AM

## 2012-10-20 NOTE — Patient Instructions (Signed)
Your physician has requested that you have a TEE. During a TEE, sound waves are used to create images of your heart. It provides your doctor with information about the size and shape of your heart and how well your heart's chambers and valves are working. In this test, a transducer is attached to the end of a flexible tube that's guided down your throat and into your esophagus (the tube leading from you mouth to your stomach) to get a more detailed image of your heart. You are not awake for the procedure. Please see the instruction sheet given to you today. For further information please visit [email or web address] Thursday December 19,2013   Your physician wants you to follow-up in: 6 months with Dr Shirlee Latch. (June 2014). You will receive a reminder letter in the mail two months in advance. If you don't receive a letter, please call our office to schedule the follow-up appointment.

## 2012-10-22 ENCOUNTER — Other Ambulatory Visit: Payer: Self-pay | Admitting: Cardiology

## 2012-10-22 DIAGNOSIS — Q211 Atrial septal defect, unspecified: Secondary | ICD-10-CM

## 2012-10-23 ENCOUNTER — Encounter (HOSPITAL_COMMUNITY)
Admission: RE | Disposition: A | Discharge status: Home / Self Care | Payer: Self-pay | Source: Ambulatory Visit | Attending: Cardiology

## 2012-10-23 ENCOUNTER — Ambulatory Visit (HOSPITAL_COMMUNITY)
Admission: RE | Admit: 2012-10-23 | Discharge: 2012-10-23 | Disposition: A | Discharge status: Home / Self Care | Payer: Medicaid Other | Source: Ambulatory Visit | Attending: Cardiology | Admitting: Cardiology

## 2012-10-23 ENCOUNTER — Ambulatory Visit (HOSPITAL_COMMUNITY): Payer: Medicaid Other | Admitting: Anesthesiology

## 2012-10-23 ENCOUNTER — Encounter (HOSPITAL_COMMUNITY): Payer: Self-pay | Admitting: Anesthesiology

## 2012-10-23 DIAGNOSIS — Q9389 Other deletions from the autosomes: Secondary | ICD-10-CM | POA: Insufficient documentation

## 2012-10-23 DIAGNOSIS — F840 Autistic disorder: Secondary | ICD-10-CM | POA: Insufficient documentation

## 2012-10-23 DIAGNOSIS — Q2111 Secundum atrial septal defect: Secondary | ICD-10-CM | POA: Insufficient documentation

## 2012-10-23 DIAGNOSIS — F71 Moderate intellectual disabilities: Secondary | ICD-10-CM | POA: Insufficient documentation

## 2012-10-23 DIAGNOSIS — Q211 Atrial septal defect: Secondary | ICD-10-CM | POA: Insufficient documentation

## 2012-10-23 HISTORY — PX: TEE WITHOUT CARDIOVERSION: SHX5443

## 2012-10-23 SURGERY — ECHOCARDIOGRAM, TRANSESOPHAGEAL
Anesthesia: Monitor Anesthesia Care

## 2012-10-23 SURGERY — ECHOCARDIOGRAM, TRANSESOPHAGEAL
Anesthesia: Moderate Sedation

## 2012-10-23 MED ORDER — SODIUM CHLORIDE 0.9 % IV SOLN
INTRAVENOUS | Status: DC
  Administered 2012-10-23: 500 mL via INTRAVENOUS

## 2012-10-23 MED ORDER — FENTANYL CITRATE 0.05 MG/ML IJ SOLN
INTRAMUSCULAR | Status: AC
  Filled 2012-10-23: qty 4

## 2012-10-23 MED ORDER — FENTANYL CITRATE 0.05 MG/ML IJ SOLN
INTRAMUSCULAR | Status: DC | PRN
  Administered 2012-10-23: 25 ug via INTRAVENOUS

## 2012-10-23 MED ORDER — BUTAMBEN-TETRACAINE-BENZOCAINE 2-2-14 % EX AERO
INHALATION_SPRAY | CUTANEOUS | Status: DC | PRN
  Administered 2012-10-23: 2 via TOPICAL

## 2012-10-23 MED ORDER — MIDAZOLAM HCL 10 MG/2ML IJ SOLN
INTRAMUSCULAR | Status: DC | PRN
  Administered 2012-10-23: 2 mg via INTRAVENOUS

## 2012-10-23 MED ORDER — PROPOFOL INFUSION 10 MG/ML OPTIME
INTRAVENOUS | Status: DC | PRN
  Administered 2012-10-23: 100 ug/kg/min via INTRAVENOUS

## 2012-10-23 MED ORDER — SODIUM CHLORIDE 0.9 % IV SOLN
INTRAVENOUS | Status: DC | PRN
  Administered 2012-10-23: 11:00:00 via INTRAVENOUS

## 2012-10-23 MED ORDER — PROPOFOL 10 MG/ML IV BOLUS
INTRAVENOUS | Status: DC | PRN
  Administered 2012-10-23: 60 mg via INTRAVENOUS

## 2012-10-23 MED ORDER — LIDOCAINE HCL (CARDIAC) 20 MG/ML IV SOLN
INTRAVENOUS | Status: DC | PRN
  Administered 2012-10-23: 100 mg via INTRAVENOUS

## 2012-10-23 MED ORDER — MIDAZOLAM HCL 5 MG/ML IJ SOLN
INTRAMUSCULAR | Status: AC
  Filled 2012-10-23: qty 4

## 2012-10-23 NOTE — CV Procedure (Signed)
Procedure: TEE  Indication: Assess ASD  Sedation: Versed 2 mg IV, Fentanyl 25 mcg IV, Propofol 210 mg IV.  Anesthesiology provided sedation.   Findings: Please see report in echo section for full details.  The left ventricle was normal in size and systolic function, EF 55% .  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  The right ventricle was moderately dilated with mildly decreased systolic function.  Mild to moderate right atrial dilation.  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  There did not appear to be significant pulmonic insufficiency.  There was turbulence across the pulmonic valve but gradient was not measured.  Valve appeared to open reasonably well.  No significant TR.   No complications.   Marca Ancona 10/23/2012 11:53 AM

## 2012-10-23 NOTE — Anesthesia Postprocedure Evaluation (Signed)
  Anesthesia Post-op Note  Patient: [name]  Procedure(s) Performed: Procedure(s) (LRB) with comments: TRANSESOPHAGEAL ECHOCARDIOGRAM (TEE) (N/A) - Dr. requesting [name]. come over to push propofol/ pt is hard to sedate  Patient Location: PACU  Anesthesia Type:MAC  Level of Consciousness: awake, alert  and oriented  Airway and Oxygen Therapy: Patient Spontanous Breathing and Patient connected to nasal cannula oxygen  Post-op Pain: none  Post-op Assessment: Post-op Vital signs reviewed, Patient's Cardiovascular Status Stable, Respiratory Function Stable, Patent Airway, No signs of Nausea or vomiting and Pain level controlled  Post-op Vital Signs: Reviewed and stable  Complications: No apparent anesthesia complications

## 2012-10-23 NOTE — Transfer of Care (Signed)
Immediate Anesthesia Transfer of Care Note  Patient: [name]  Procedure(s) Performed: Procedure(s) (LRB) with comments: TRANSESOPHAGEAL ECHOCARDIOGRAM (TEE) (N/A) - Dr. requesting [name]. come over to push propofol/ pt is hard to sedate  Patient Location: PACU  Anesthesia Type:MAC  Level of Consciousness: awake, alert  and oriented  Airway & Oxygen Therapy: Patient Spontanous Breathing and Patient connected to nasal cannula oxygen  Post-op Assessment: Report given to PACU RN  Post vital signs: Reviewed and stable  Complications: No apparent anesthesia complications

## 2012-10-23 NOTE — Anesthesia Preprocedure Evaluation (Addendum)
Anesthesia Evaluation  Patient identified by MRN, date of birth, ID band Patient awake    Reviewed: Allergy & Precautions, H&P , NPO status , Patient's Chart, lab work & pertinent test results  History of Anesthesia Complications Negative for: history of anesthetic complications  Airway Mallampati: II      Dental  (+) Teeth Intact and Dental Advisory Given   Pulmonary neg pulmonary ROS,    Pulmonary exam normal       Cardiovascular negative cardio ROS  Rhythm:Irregular     Neuro/Psych PSYCHIATRIC DISORDERS negative neurological ROS     GI/Hepatic negative GI ROS, Neg liver ROS,   Endo/Other  negative endocrine ROS  Renal/GU negative Renal ROS     Musculoskeletal   Abdominal   Peds  Hematology negative hematology ROS (+)   Anesthesia Other Findings   Reproductive/Obstetrics                         Anesthesia Physical Anesthesia Plan  ASA: III  Anesthesia Plan: MAC   Post-op Pain Management:    Induction: Intravenous  Airway Management Planned: Mask  Additional Equipment:   Intra-op Plan:   Post-operative Plan:   Informed Consent: I have reviewed the patients History and Physical, chart, labs and discussed the procedure including the risks, benefits and alternatives for the proposed anesthesia with the patient or authorized representative who has indicated his/her understanding and acceptance.   Dental advisory given and Consent reviewed with POA  Plan Discussed with: CRNA, Anesthesiologist and Surgeon  Anesthesia Plan Comments: (ASD, pulm stenosis, dilated Right atrium.  )       Anesthesia Quick Evaluation

## 2012-10-23 NOTE — Interval H&P Note (Signed)
History and Physical Interval Note:  10/23/2012 11:25 AM  [name]  has presented today for surgery, with the diagnosis of pulmonic stenosis   The various methods of treatment have been discussed with the patient and family. After consideration of risks, benefits and other options for treatment, the patient has consented to  Procedure(s) (LRB) with comments: TRANSESOPHAGEAL ECHOCARDIOGRAM (TEE) (N/A) - Dr. requesting [name]. come over to push propofol/ pt is hard to sedate as a surgical intervention .  The patient's history has been reviewed, patient examined, no change in status, stable for surgery.  I have reviewed the patient's chart and labs.  Questions were answered to the patient's satisfaction.     Clemente Dewey [employer]

## 2012-10-23 NOTE — Preoperative (Signed)
Beta Blockers   Reason not to administer Beta Blockers:Not Applicable 

## 2012-10-23 NOTE — H&P (View-Only) (Signed)
Patient ID: [name], male   DOB: 06/23/1986, 26 y.o.   MRN: [id number] PCP: Dr. 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  Patient was followed in the past by a pediatric cardiologist then by Dr. Kersey at [facility].  He has not had cardiology followup for several years now.  Last echo in 2005 showed normal LV EF but questioned noncompaction towards the apex, mild pulmonic stenosis, small secundum ASD, and normal tricuspid valve.  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    Patient is relatively high-functioning for autism.  He reads and writes and has a relatively good memory per his mother.  He has trouble with social interactions.  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  No exertional dyspnea or chest pain.  He participates in the [employer] and skates, bowls, swims, and plays basketball.  No history of syncope or lightheadedness.  No palpitations.    After last appointment, I had him do an echo. This was a difficult study.  LV EF was normal, there appeared to be mild PS with mean gradient 18 mmHg, the main PA was moderately dilated.  The atrial septum and RV were not well-visualized.   PMH: 1. Chromosome 8p deletion syndrome: Autism + cardiac anomalies.  He has pulmonic stenosis and a secundum ASD.  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  He is followed by a geneticist at [facility].  2. Congenital heart disease: Echo in 2005 showed EF 55-60% with ? 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  Echo (10/13) was a difficult study with EF 60-65%, mild Mark,  mild PS peak gradient 18 mmHg, dilated main PA.  Cardiac MRI (10/13) with LV EF 55%, prominent trabeculations LV apex suggestive of noncompaction, minimal PI but suspect mild PS, moderate RV dilation with EF 45% (RV EDVi 155 ml/m2), main PA dilated to 4.6 x 4.4 cm, secundum ASD, hard to tell size on MRI, Qp/Qs by MRI was 1.4/1.   3. Autism with moderate mental retardation.  4. Allergic rhinitis  SH: Living in group home and participating in a day program.  Mother very involved with his care.  Nonsmoker, no drugs.  FH: Mother with heart murmur.   ROS: All systems reviewed and negative except as per HPI.   Current Outpatient Prescriptions  Medication Sig Dispense Refill  . cetirizine (ZYRTEC) 10 MG tablet Take 10 mg by mouth daily.      . fluticasone (FLONASE) 50 MCG/ACT nasal spray INSTILL TWO   SPRAYS IN EACH NOSTRIL DAILY  16 g  6  . GNP HYDROCORTISONE MAX ST 1 % ointment APPLY TO AFFECTED AREA TWICE A DAY (FOR FACE AND ARMS)  30 g  3  . ketoconazole (NIZORAL) 2 % cream Apply topically 2 (two) times daily. To the face and arms  30 g  2    BP 114/68  Pulse 80  Ht 5' 11.5" (1.816 m)  Wt 214 lb (97.07 kg)  BMI 29.43 kg/m2 General: NAD Neck: No JVD, no thyromegaly or thyroid nodule.  Lungs:   Clear to auscultation bilaterally with normal respiratory effort. CV: Nondisplaced PMI.  Heart regular S1/S2, no S3/S4, fixed split S2, 2/6 early SEM LUSB.  No peripheral edema.  No carotid bruit.  Normal pedal pulses.  Abdomen: Soft, nontender, no hepatosplenomegaly, no distention.  Skin: Intact without lesions or rashes.  Neurologic: Alert and oriented x 3.  Psych: Normal affect. Extremities: No clubbing or cyanosis.  HEENT: Normal.   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  On exam, he appears euvolemic.  He has a murmur of PS and a fixed split S2 suggestive of ASD.  He denies exercise intolerance.  TTE and cardiac MRI have been done.   The echo was difficult but suggests mild PS and minimal PI.  The MRI showed moderate RV dilation with RV EF 45% (low normal for RV).  There did not appear to be significant PI.  There did appear to be a secundum ASD.  The main PA was dilated to 4.6 cm.   The enlarged RV (RV EDVi 155 ml/m2)  is not explained by pulmonic stenosis (degree of stenosis appears to be mild, regardless).  I question more severe pulmonic insufficiency than we have observed so far (but seems unlikely based on MRI and TTE) versus significant shunting across the secundum ASD.  The dilated PA can be seen with significant PS, but as above, do not think PS is more than mild based on our studies so far.  High flow from significant ASD could explain PA dilation.  I think that [name] warrants a TEE for closer evaluation of the ASD as it is possible this could require closure.  The dilated PA also is going to need close followup.  I am going to arrange a TEE later this week, will need anesthesia for sedation.   Mykeal Carrick 10/20/2012 10:04 AM   

## 2012-10-23 NOTE — Progress Notes (Signed)
  Echocardiogram Echocardiogram Transesophageal has been performed.  [name] 10/23/2012, 12:23 PM

## 2012-10-24 ENCOUNTER — Encounter (HOSPITAL_COMMUNITY): Payer: Self-pay | Admitting: Cardiology

## 2012-11-07 ENCOUNTER — Encounter: Payer: Medicaid Other | Admitting: Nurse Practitioner

## 2012-11-11 ENCOUNTER — Telehealth: Payer: Self-pay | Admitting: Cardiology

## 2012-11-11 ENCOUNTER — Encounter: Payer: Self-pay | Admitting: Cardiology

## 2012-11-11 ENCOUNTER — Ambulatory Visit (INDEPENDENT_AMBULATORY_CARE_PROVIDER_SITE_OTHER): Payer: Medicaid Other | Admitting: Cardiology

## 2012-11-11 VITALS — BP 110/80 | HR 67 | Ht 71.5 in | Wt 214.0 lb

## 2012-11-11 DIAGNOSIS — I428 Other cardiomyopathies: Secondary | ICD-10-CM

## 2012-11-11 DIAGNOSIS — R002 Palpitations: Secondary | ICD-10-CM

## 2012-11-11 DIAGNOSIS — Q211 Atrial septal defect: Secondary | ICD-10-CM

## 2012-11-11 DIAGNOSIS — Q221 Congenital pulmonary valve stenosis: Secondary | ICD-10-CM

## 2012-11-11 NOTE — Patient Instructions (Addendum)
You have been referred to Dr Tonny Bollman for evaluation for ASD closure. This week.   Your physician wants you to follow-up in: 6 months with Dr Shirlee Latch. (July 2014) You will receive a reminder letter in the mail two months in advance. If you don't receive a letter, please call our office to schedule the follow-up appointment.

## 2012-11-11 NOTE — Progress Notes (Signed)
Patient ID: [name], male   DOB: Mar 23, 1986, 27 y.o.   MRN: [id number] PCP: Dr. 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  Patient was followed in the past by a pediatric cardiologist then by Dr. Reyes Ivan at [facility].  He had not had cardiology followup for several years prior to his initial appointment with me.  Echo in 2005 showed normal LV EF but questioned noncompaction towards the apex, mild pulmonic stenosis, small secundum ASD, and normal tricuspid valve.  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  Patient is relatively high-functioning for autism.  He reads and writes and has a relatively good memory per his mother.  He has trouble with social interactions.  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  No exertional dyspnea or chest pain.  He participates in the [employer] and skates, bowls, swims, and plays basketball.  No history of syncope or lightheadedness.  His heart rate races at times but only with exertion (no palpitations noted at rest or with mild exertion).     After a prior appointment, I had him do an echo. This was a difficult study.  LV EF was normal, there appeared to be mild PS with mean gradient 18 mmHg, the main PA was moderately dilated.  The atrial septum and RV were not well-visualized.  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  Given the ASD and RV dilation, TEE was done.  This confirmed at 1.5 cm secundum ASD with left to right flow and RV dilation.    PMH: 1. Chromosome 8p deletion syndrome: Autism + cardiac anomalies.  He has pulmonic stenosis and a secundum ASD.  There is note in the chart of Ebstein's  anomaly but this was not seen on last echo or cMRI.  He is followed by a geneticist at [facility].  2. Congenital heart disease: Echo in 2005 showed EF 55-60% with ? 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  Echo (10/13) was a difficult study with EF 60-65%, mild MR, mild PS peak gradient 18 mmHg, dilated main PA.  Cardiac MRI (10/13) with LV EF 55%, prominent trabeculations LV apex suggestive of noncompaction, minimal PI but suspect mild PS, moderate RV dilation with EF 45% (RV EDVi 155 ml/m2), main PA dilated to 4.6 x 4.4 cm, secundum ASD, hard to tell size on MRI, Qp/Qs by MRI was 1.4/1.  TEE (12/13): EF 55%, LV noncompaction, RV mild-moderately dilated with normal systolic function, 1.5 cm secundum ASD with left to right flow, no significant PI, unable to measure PV gradient but does not look like significant PS.  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 Autism with moderate mental retardation.  4. Allergic rhinitis  SH: Living in group home and participating in a day program.  Mother very involved with his care.  Nonsmoker, no drugs.  FH: Mother with heart murmur.  ROS: All systems reviewed and negative except as per HPI.   Current Outpatient Prescriptions  Medication Sig Dispense Refill  . cetirizine (ZYRTEC) 10 MG tablet Take 10 mg by mouth daily.      . fluticasone (FLONASE) 50 MCG/ACT nasal spray INSTILL TWO   SPRAYS IN EACH NOSTRIL DAILY  16 g  6  . GNP HYDROCORTISONE MAX ST 1 % ointment APPLY TO AFFECTED AREA TWICE A DAY (FOR FACE AND ARMS)  30 g  3  . ketoconazole (NIZORAL) 2 % cream Apply topically 2 (two) times daily. To the face and arms  30 g  2    BP 110/80  Pulse 67  Ht 5' 11.5" (1.816 m)  Wt 214 lb (97.07 kg)  BMI 29.43  kg/m2 General: NAD Neck: No JVD, no thyromegaly or thyroid nodule.  Lungs: Clear to auscultation bilaterally with normal respiratory effort. CV: Nondisplaced PMI.  Heart regular S1/S2, no S3/S4, fixed split S2, 2/6 early SEM LUSB.  No peripheral edema.  No carotid bruit.  Normal pedal pulses.  Abdomen: Soft, nontender, no hepatosplenomegaly, no distention.  Skin: Intact without lesions or rashes.  Neurologic: Alert and oriented x 3.  Psych: Normal affect. Extremities: No clubbing or cyanosis.  HEENT: Normal.   Assessment/Plan: 1. Secundum ASD: 1.5 cm on TEE with left to right shunt.  Enlarged RV is not explained by pulmonic stenosis and there is not significant pulmonic insufficiency. Most likely the ASD is the cause of enlarged RV.  At this point, given RV enlargement, I think that he needs evaluation for catheter-based closure of the ASD.  I am going to refer him to Dr. Excell Seltzer for evaluation.  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  LV EF is preserved.   3. 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  This can be seen with pulmonic stenosis (appears to be mild in this patient).  High flow from ASD may also contribute.  I discussed this finding with Dr. Meredeth Ide.  For now, will continue to monitor.  Will plan yearly MRAs for the time being.  4. Pulmonic stenosis: Mild by echo.  Visually appears mild.  No significant pulmonic insufficiency was visualized.  5. Racing HR with exercise: I think this is likely physiologic.  If he notes palpitations while at rest, would have him wear a holter monitor.   Marca Ancona 11/11/2012 12:00 PM

## 2012-11-11 NOTE — Telephone Encounter (Signed)
New Problem:    Patient's mother called in wanting to have a monitor ordered for her son.  Please call back.

## 2012-11-11 NOTE — Telephone Encounter (Signed)
Spoke with pt's mother, Letithia. She has decided that she would like for Dr Shirlee Latch to order 48 hour monitor for pt. This is OK per Dr Shirlee Latch.

## 2012-11-13 ENCOUNTER — Encounter (INDEPENDENT_AMBULATORY_CARE_PROVIDER_SITE_OTHER): Payer: Medicaid Other

## 2012-11-13 ENCOUNTER — Ambulatory Visit (INDEPENDENT_AMBULATORY_CARE_PROVIDER_SITE_OTHER): Payer: Medicaid Other | Admitting: Cardiovascular Disease

## 2012-11-13 ENCOUNTER — Encounter: Payer: Self-pay | Admitting: Cardiology

## 2012-11-13 ENCOUNTER — Encounter: Payer: Self-pay | Admitting: *Deleted

## 2012-11-13 ENCOUNTER — Telehealth: Payer: Self-pay | Admitting: *Deleted

## 2012-11-13 ENCOUNTER — Encounter: Payer: Self-pay | Admitting: Cardiovascular Disease

## 2012-11-13 VITALS — BP 130/77 | HR 73 | Ht 74.0 in | Wt 215.0 lb

## 2012-11-13 DIAGNOSIS — R002 Palpitations: Secondary | ICD-10-CM

## 2012-11-13 DIAGNOSIS — Q211 Atrial septal defect: Secondary | ICD-10-CM

## 2012-11-13 LAB — CBC WITH DIFFERENTIAL/PLATELET
Basophils Absolute: 0 10*3/uL (ref 0.0–0.1)
Eosinophils Absolute: 0.3 10*3/uL (ref 0.0–0.7)
Lymphocytes Relative: 40.9 % (ref 12.0–46.0)
Monocytes Relative: 6.3 % (ref 3.0–12.0)
Platelets: 197 10*3/uL (ref 150.0–400.0)
RDW: 13.9 % (ref 11.5–14.6)

## 2012-11-13 LAB — BASIC METABOLIC PANEL
CO2: 33 mEq/L — ABNORMAL HIGH (ref 19–32)
Calcium: 9.6 mg/dL (ref 8.4–10.5)
Chloride: 100 mEq/L (ref 96–112)
Sodium: 138 mEq/L (ref 135–145)

## 2012-11-13 LAB — PROTIME-INR
INR: 1.3 ratio — ABNORMAL HIGH (ref 0.8–1.0)
Prothrombin Time: 13.6 s — ABNORMAL HIGH (ref 10.2–12.4)

## 2012-11-13 LAB — APTT: aPTT: 41 s — ABNORMAL HIGH (ref 21.7–28.8)

## 2012-11-13 MED ORDER — ASPIRIN EC 81 MG PO TBEC: 81.0000 mg | DELAYED_RELEASE_TABLET | Freq: Every day | ORAL | Status: DC

## 2012-11-13 MED ORDER — CLOPIDOGREL BISULFATE 75 MG PO TABS: 75.0000 mg | ORAL_TABLET | Freq: Every day | ORAL | Status: DC

## 2012-11-13 NOTE — Telephone Encounter (Signed)
48 hr holter monitor placed on pt 11/13/12. TK

## 2012-11-13 NOTE — Patient Instructions (Addendum)
START TODAY:  Aspirin 81mg  daily  &  Plavix 75mg  daily.  [email or web address]

## 2012-11-13 NOTE — Progress Notes (Signed)
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 He is unable to provide much history, so history is primarily obtained from his mother and brother. He has previously been followed by pediatric cardiology and more recently by Dr Shirlee Latch in our practice. Past cardiac history has been notable for mild pulmonic stenosis and a small ASD. He underwent recent echo, cardiac MRI, and TEE. Results of these studies are below, but notably they showed a dilated RV, dilated PA, pulmonic stenosis with an 18 mm transvalvular pressure gradient, and a moderate-large secundum ASD.  The patient denies chest pain, dyspnea, palpitations, or syncope. His mother reports he can do normal activities without symptoms. He has competed in the [employer] and has not had obvious symptoms related to exertion.  Outpatient Encounter Prescriptions as of 11/13/2012  Medication Sig Dispense Refill  . cetirizine (ZYRTEC) 10 MG tablet Take 10 mg by mouth daily.      . fluticasone (FLONASE) 50 MCG/ACT nasal spray INSTILL TWO   SPRAYS IN EACH NOSTRIL DAILY  16 g  6  . GNP HYDROCORTISONE MAX ST 1 % ointment APPLY TO AFFECTED AREA TWICE A DAY (FOR FACE AND ARMS)  30 g  3  . ketoconazole (NIZORAL) 2 % cream Apply topically 2 (two) times daily. To the face and arms  30 g  2    Sulfa antibiotics  Past Medical History  Diagnosis Date  . UTI (urinary tract infection), uncomplicated 12/2010    Tx c Bactrim  . Pulmonary artery stenosis, main   . Autism disorder   . Mental retardation     Past Surgical History  Procedure Date  . Tonsillectomy   . Tee without cardioversion 10/23/2012    Procedure: TRANSESOPHAGEAL ECHOCARDIOGRAM (TEE);  Surgeon: Laurey Morale, MD;  Location: [facility];  Service: Cardiovascular;  Laterality: N/A;  Dr. requesting [name]. come over to push propofol/ pt is hard to sedate    History    Social History  . Marital Status: Single    Spouse Name: N/A    Number of Children: N/A  . Years of Education: N/A   Occupational History  . Not on file.   Social History Main Topics  . Smoking status: Never Smoker   . Smokeless tobacco: Never Used  . Alcohol Use: No  . Drug Use: No  . Sexually Active: Not on file   Other Topics Concern  . Not on file   Social History Narrative  . No narrative on file   Family History: No history of congenital heart disease in the family  ROS:  Negative except as per HPI  BP 130/77  Pulse 73  Ht 6\' 2"  (1.88 m)  Wt 97.523 kg (215 lb)  BMI 27.60 kg/m2  PHYSICAL EXAM: General: Alert, NAD HEENT: normal Neck: JVP normal. Carotid upstrokes normal without bruits. No thyromegaly. Lungs: equal expansion, clear bilaterally CV: Apex is discrete and nondisplaced, RRR with 2/6 systolic ejection murmur at the LUSB, fixed split S2 Abd: soft, NT, +BS, no bruit, no hepatosplenomegaly Back: no CVA tenderness Ext: no C/C/E        Femoral pulses 2+= without bruits        DP/PT pulses intact and = Skin: warm and dry without rash Neuro: CNII-XII intact             Strength intact = bilaterally  EKG:  11/11/2012: NSR, within normal  limits  2D ECHO: Left ventricle: The cavity size was normal. Wall thickness was normal. Systolic function was normal. The estimated ejection fraction was in the range of 60% to 65%.  ------------------------------------------------------------ Aortic valve: Structurally normal valve. Cusp separation was normal. Doppler: Transvalvular velocity was within the normal range. There was no stenosis. No regurgitation.  ------------------------------------------------------------ Aorta: Aortic root: The aortic root was normal in size. Ascending aorta: The ascending aorta was normal in size.  ------------------------------------------------------------ Mitral valve: Doppler: Mild regurgitation. Peak gradient: 3mm Hg  (D).  ------------------------------------------------------------ Left atrium: The atrium was normal in size.  ------------------------------------------------------------ Right ventricle: The cavity size was normal. Wall thickness was normal. Systolic function was normal.  ------------------------------------------------------------ Pulmonic valve: Doppler: No significant regurgitation. Peak gradient: 18mm Hg (S).  ------------------------------------------------------------ Tricuspid valve: Doppler: Trivial regurgitation.  ------------------------------------------------------------ Pulmonary artery: The main pulmonary artery was moderately dilated. Systolic pressure was within the normal range.  ------------------------------------------------------------ Right atrium: The atrium was normal in size.  ------------------------------------------------------------ Pericardium: There was no pericardial effusion.  ------------------------------------------------------------ Systemic veins: Inferior vena cava: The vessel was normal in size; the respirophasic diameter changes were in the normal range (= 50%); findings are consistent with normal central venous Pressure.  TEE: Study Conclusions  - Left ventricle: The cavity size was normal. The estimated ejection fraction was 55%. Wall motion was normal; there were no regional wall motion abnormalities. 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 - Aortic valve: There was no stenosis. - Aorta: Normal caliber thoracic aorta. - Mitral valve: Trivial regurgitation. - Left atrium: No evidence of thrombus in the atrial cavity or appendage. - Right ventricle: The cavity size was mildly to moderately dilated. Systolic function was normal. - Right atrium: The atrium was mildly dilated. - Atrial septum: 1.5 cm secundum ASD with left to right shunting. - Tricuspid valve: No significant regurgitation. -  Pulmonic valve: The pulmonic valve was poorly visualized. There was turbulence across the valve but it did appear to open reasonably well. There did not appear to be significant pulmonic insufficiency.  CARDIAC MRI: IMPRESSION:  1. The RV was moderately dilated with low normal systolic function,  EF 45%. RV EDVi 155 mL/m2.  2. Normal LV size and systolic function, EF 55%. 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  3. Mild pulmonic stenosis noted by echo fits pulmonary valve  evaluation by MRI. There was minimal pulmonic insufficiency noted  by MRI.  4. 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  5. Secundum ASD noted, size difficult to estimate due to thin  interatrial septum.  6. Pulmonic stenosis, even if more severe, would not explain the  dilated right ventricle. I would be concerned for either  unrecognized more significant pulmonic insufficiency (which seems  unlikely based on MRI and echo findings) or significant shunting  across the secundum ASD. The Qp/Qs using RV SV and LV SV is  elevated at 1.4/1 (but not markedly elevated). The dilated  pulmonary artery could be seen with significant PS, though the PS  seems to be only mild. High flow (such as from a shunt lesion)  also could lead to pulmonary artery dilation. Would consider TEE to  more closely assess the ASD.  ASSESSMENT AND PLAN:  OSTIUM SECUNDUM ASD The patient has a relatively large secundum ASD. His RV is dilated and this is suggestive of RV volume overload. ASD closure is clearly indicated. I have carefully reviewed his TEE and the ASD anatomy appears amenable to transcatheter closure. I  have reviewed the risks, indications, and alternatives to ASD closure with the patient and his family. They understand the risks include bleeding, infection, MI, stroke, device embolism, cardiac perforation, tamponade, and death. The risk of serious complication is approximately 1%. They agree to  proceed as planned. The patient will require general anesthesia because of his autism and inability to tolerate lying still. Will arrange TEE guidance for the procedure with Dr Shirlee Latch.   I have recommended that he start ASA and plavix which will be required for a period of 6 months. Will perform a right heart catheterization at the time of the procedure to assess pulmonary pressures and degree of pulmonic stenosis which appears mild by echo criteria.  Tonny Bollman 11/20/2012 6:58 AM

## 2012-11-17 ENCOUNTER — Encounter (HOSPITAL_COMMUNITY): Payer: Self-pay | Admitting: Respiratory Therapy

## 2012-11-24 ENCOUNTER — Encounter (HOSPITAL_COMMUNITY): Payer: Self-pay | Admitting: Anesthesiology

## 2012-11-24 ENCOUNTER — Encounter (HOSPITAL_COMMUNITY)
Admission: RE | Disposition: A | Discharge status: Home / Self Care | Payer: Self-pay | Source: Ambulatory Visit | Attending: Cardiovascular Disease

## 2012-11-24 ENCOUNTER — Ambulatory Visit (HOSPITAL_COMMUNITY): Payer: Medicaid Other | Admitting: Anesthesiology

## 2012-11-24 ENCOUNTER — Ambulatory Visit (HOSPITAL_COMMUNITY)
Admission: RE | Admit: 2012-11-24 | Discharge: 2012-11-25 | Disposition: A | Discharge status: Home / Self Care | Payer: Medicaid Other | Source: Ambulatory Visit | Attending: Cardiovascular Disease | Admitting: Cardiovascular Disease

## 2012-11-24 DIAGNOSIS — Q211 Atrial septal defect, unspecified: Secondary | ICD-10-CM

## 2012-11-24 DIAGNOSIS — Q2111 Secundum atrial septal defect: Secondary | ICD-10-CM | POA: Insufficient documentation

## 2012-11-24 DIAGNOSIS — Q221 Congenital pulmonary valve stenosis: Secondary | ICD-10-CM | POA: Diagnosis present

## 2012-11-24 DIAGNOSIS — Q225 Ebstein's anomaly: Secondary | ICD-10-CM

## 2012-11-24 DIAGNOSIS — F840 Autistic disorder: Secondary | ICD-10-CM | POA: Diagnosis present

## 2012-11-24 HISTORY — PX: TRANSESOPHAGEAL ECHOCARDIOGRAM W/O CARDIOVERSION: SHX5495

## 2012-11-24 HISTORY — DX: Atrial septal defect: Q21.1

## 2012-11-24 HISTORY — DX: Atrial septal defect, unspecified: Q21.10

## 2012-11-24 HISTORY — PX: ASD REPAIR: SHX258

## 2012-11-24 LAB — POCT I-STAT 3, VENOUS BLOOD GAS (G3P V)
Acid-Base Excess: 1 mmol/L (ref 0.0–2.0)
Bicarbonate: 24.7 mEq/L — ABNORMAL HIGH (ref 20.0–24.0)
Bicarbonate: 25.8 mEq/L — ABNORMAL HIGH (ref 20.0–24.0)
O2 Saturation: 83 %
TCO2: 26 mmol/L (ref 0–100)
pCO2, Ven: 37.8 mmHg — ABNORMAL LOW (ref 45.0–50.0)
pH, Ven: 7.442 — ABNORMAL HIGH (ref 7.250–7.300)
pO2, Ven: 47 mmHg — ABNORMAL HIGH (ref 30.0–45.0)
pO2, Ven: 58 mmHg — ABNORMAL HIGH (ref 30.0–45.0)

## 2012-11-24 SURGERY — TRANSESOPHAGEAL ECHOCARDIOGRAM W/O CARDIOVERSION
Anesthesia: LOCAL

## 2012-11-24 MED ORDER — ACETAMINOPHEN 325 MG PO TABS: 650.0000 mg | ORAL_TABLET | ORAL | Status: DC | PRN

## 2012-11-24 MED ORDER — CLOPIDOGREL BISULFATE 75 MG PO TABS: 75.0000 mg | ORAL_TABLET | Freq: Once | ORAL | Status: DC

## 2012-11-24 MED ORDER — ASPIRIN 81 MG PO CHEW: 81.0000 mg | CHEWABLE_TABLET | ORAL | Status: DC

## 2012-11-24 MED ORDER — HEPARIN (PORCINE) IN NACL 2-0.9 UNIT/ML-% IJ SOLN
INTRAMUSCULAR | Status: AC
  Filled 2012-11-24: qty 1000

## 2012-11-24 MED ORDER — SODIUM CHLORIDE 0.9 % IJ SOLN: 3.0000 mL | Freq: Two times a day (BID) | INTRAMUSCULAR | Status: DC

## 2012-11-24 MED ORDER — OXYCODONE-ACETAMINOPHEN 5-325 MG PO TABS: 1.0000 | ORAL_TABLET | ORAL | Status: DC | PRN

## 2012-11-24 MED ORDER — SODIUM CHLORIDE 0.9 % IV SOLN: 1.0000 mL/kg/h | INTRAVENOUS | Status: AC

## 2012-11-24 MED ORDER — HEPARIN SODIUM (PORCINE) 1000 UNIT/ML IJ SOLN
INTRAMUSCULAR | Status: AC
  Filled 2012-11-24: qty 1

## 2012-11-24 MED ORDER — LORATADINE 10 MG PO TABS
10.0000 mg | ORAL_TABLET | Freq: Every day | ORAL | Status: DC
  Administered 2012-11-25: 09:00:00 10 mg via ORAL
  Filled 2012-11-24: qty 1

## 2012-11-24 MED ORDER — ONDANSETRON HCL 4 MG/2ML IJ SOLN: 4.0000 mg | Freq: Four times a day (QID) | INTRAMUSCULAR | Status: DC | PRN

## 2012-11-24 MED ORDER — SODIUM CHLORIDE 0.9 % IV SOLN: 250.0000 mL | INTRAVENOUS | Status: DC | PRN

## 2012-11-24 MED ORDER — LIDOCAINE HCL (PF) 1 % IJ SOLN
INTRAMUSCULAR | Status: AC
  Filled 2012-11-24: qty 30

## 2012-11-24 MED ORDER — HYDROCORTISONE 1 % EX OINT
1.0000 "application " | TOPICAL_OINTMENT | Freq: Two times a day (BID) | CUTANEOUS | Status: DC
  Administered 2012-11-24 – 2012-11-25 (×2): 1 via TOPICAL
  Filled 2012-11-24: qty 28.35

## 2012-11-24 MED ORDER — SODIUM CHLORIDE 0.9 % IV SOLN: INTRAVENOUS | Status: DC

## 2012-11-24 MED ORDER — CLOPIDOGREL BISULFATE 75 MG PO TABS
75.0000 mg | ORAL_TABLET | Freq: Every day | ORAL | Status: DC
  Administered 2012-11-25: 09:00:00 75 mg via ORAL
  Filled 2012-11-24: qty 1

## 2012-11-24 MED ORDER — SODIUM CHLORIDE 0.9 % IJ SOLN: 3.0000 mL | INTRAMUSCULAR | Status: DC | PRN

## 2012-11-24 MED ORDER — CEFAZOLIN SODIUM 1-5 GM-% IV SOLN
INTRAVENOUS | Status: AC
  Filled 2012-11-24: qty 50

## 2012-11-24 MED ORDER — ASPIRIN EC 81 MG PO TBEC
81.0000 mg | DELAYED_RELEASE_TABLET | Freq: Every day | ORAL | Status: DC
  Administered 2012-11-25: 81 mg via ORAL
  Filled 2012-11-24 (×2): qty 1

## 2012-11-24 MED ORDER — CEFAZOLIN SODIUM 1-5 GM-% IV SOLN
1.0000 g | Freq: Three times a day (TID) | INTRAVENOUS | Status: DC
  Filled 2012-11-24 (×2): qty 50

## 2012-11-24 MED ORDER — FLUTICASONE PROPIONATE 50 MCG/ACT NA SUSP
2.0000 | Freq: Every day | NASAL | Status: DC
  Administered 2012-11-25: 09:00:00 2 via NASAL
  Filled 2012-11-24: qty 16

## 2012-11-24 MED ORDER — HEPARIN (PORCINE) IN NACL 2-0.9 UNIT/ML-% IJ SOLN
INTRAMUSCULAR | Status: AC
  Filled 2012-11-24: qty 1500

## 2012-11-24 NOTE — H&P (View-Only) (Signed)
 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 He is unable to provide much history, so history is primarily obtained from his mother and brother. He has previously been followed by pediatric cardiology and more recently by Dr McLean in our practice. Past cardiac history has been notable for mild pulmonic stenosis and a small ASD. He underwent recent echo, cardiac MRI, and TEE. Results of these studies are below, but notably they showed a dilated RV, dilated PA, pulmonic stenosis with an 18 mm transvalvular pressure gradient, and a moderate-large secundum ASD.  The patient denies chest pain, dyspnea, palpitations, or syncope. His mother reports he can do normal activities without symptoms. He has competed in the [employer] and has not had obvious symptoms related to exertion.  Outpatient Encounter Prescriptions as of 11/13/2012  Medication Sig Dispense Refill  . cetirizine (ZYRTEC) 10 MG tablet Take 10 mg by mouth daily.      . fluticasone (FLONASE) 50 MCG/ACT nasal spray INSTILL TWO   SPRAYS IN EACH NOSTRIL DAILY  16 g  6  . GNP HYDROCORTISONE MAX ST 1 % ointment APPLY TO AFFECTED AREA TWICE A DAY (FOR FACE AND ARMS)  30 g  3  . ketoconazole (NIZORAL) 2 % cream Apply topically 2 (two) times daily. To the face and arms  30 g  2    Sulfa antibiotics  Past Medical History  Diagnosis Date  . UTI (urinary tract infection), uncomplicated 12/2010    Tx c Bactrim  . Pulmonary artery stenosis, main   . Autism disorder   . Mental retardation     Past Surgical History  Procedure Date  . Tonsillectomy   . Tee without cardioversion 10/23/2012    Procedure: TRANSESOPHAGEAL ECHOCARDIOGRAM (TEE);  Surgeon: Dalton S McLean, MD;  Location: [facility];  Service: Cardiovascular;  Laterality: N/A;  Dr. requesting Anes. come over to push propofol/ pt is hard to sedate    History    Social History  . Marital Status: Single    Spouse Name: N/A    Number of Children: N/A  . Years of Education: N/A   Occupational History  . Not on file.   Social History Main Topics  . Smoking status: Never Smoker   . Smokeless tobacco: Never Used  . Alcohol Use: No  . Drug Use: No  . Sexually Active: Not on file   Other Topics Concern  . Not on file   Social History Narrative  . No narrative on file   Family History: No history of congenital heart disease in the family  ROS:  Negative except as per HPI  BP 130/77  Pulse 73  Ht 6' 2" (1.88 m)  Wt 97.523 kg (215 lb)  BMI 27.60 kg/m2  PHYSICAL EXAM: General: Alert, NAD HEENT: normal Neck: JVP normal. Carotid upstrokes normal without bruits. No thyromegaly. Lungs: equal expansion, clear bilaterally CV: Apex is discrete and nondisplaced, RRR with 2/6 systolic ejection murmur at the LUSB, fixed split S2 Abd: soft, NT, +BS, no bruit, no hepatosplenomegaly Back: no CVA tenderness Ext: no C/C/E        Femoral pulses 2+= without bruits        DP/PT pulses intact and = Skin: warm and dry without rash Neuro: CNII-XII intact             Strength intact = bilaterally  EKG:  11/11/2012: NSR, within normal   limits  2D ECHO: Left ventricle: The cavity size was normal. Wall thickness was normal. Systolic function was normal. The estimated ejection fraction was in the range of 60% to 65%.  ------------------------------------------------------------ Aortic valve: Structurally normal valve. Cusp separation was normal. Doppler: Transvalvular velocity was within the normal range. There was no stenosis. No regurgitation.  ------------------------------------------------------------ Aorta: Aortic root: The aortic root was normal in size. Ascending aorta: The ascending aorta was normal in size.  ------------------------------------------------------------ Mitral valve: Doppler: Mild regurgitation. Peak gradient: 3mm Hg  (D).  ------------------------------------------------------------ Left atrium: The atrium was normal in size.  ------------------------------------------------------------ Right ventricle: The cavity size was normal. Wall thickness was normal. Systolic function was normal.  ------------------------------------------------------------ Pulmonic valve: Doppler: No significant regurgitation. Peak gradient: 18mm Hg (S).  ------------------------------------------------------------ Tricuspid valve: Doppler: Trivial regurgitation.  ------------------------------------------------------------ Pulmonary artery: The main pulmonary artery was moderately dilated. Systolic pressure was within the normal range.  ------------------------------------------------------------ Right atrium: The atrium was normal in size.  ------------------------------------------------------------ Pericardium: There was no pericardial effusion.  ------------------------------------------------------------ Systemic veins: Inferior vena cava: The vessel was normal in size; the respirophasic diameter changes were in the normal range (= 50%); findings are consistent with normal central venous Pressure.  TEE: Study Conclusions  - Left ventricle: The cavity size was normal. The estimated ejection fraction was 55%. Wall motion was normal; there were no regional wall motion abnormalities. 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 - Aortic valve: There was no stenosis. - Aorta: Normal caliber thoracic aorta. - Mitral valve: Trivial regurgitation. - Left atrium: No evidence of thrombus in the atrial cavity or appendage. - Right ventricle: The cavity size was mildly to moderately dilated. Systolic function was normal. - Right atrium: The atrium was mildly dilated. - Atrial septum: 1.5 cm secundum ASD with left to right shunting. - Tricuspid valve: No significant regurgitation. -  Pulmonic valve: The pulmonic valve was poorly visualized. There was turbulence across the valve but it did appear to open reasonably well. There did not appear to be significant pulmonic insufficiency.  CARDIAC MRI: IMPRESSION:  1. The RV was moderately dilated with low normal systolic function,  EF 45%. RV EDVi 155 mL/m2.  2. Normal LV size and systolic function, EF 55%. 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  3. Mild pulmonic stenosis noted by echo fits pulmonary valve  evaluation by MRI. There was minimal pulmonic insufficiency noted  by MRI.  4. 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  5. Secundum ASD noted, size difficult to estimate due to thin  interatrial septum.  6. Pulmonic stenosis, even if more severe, would not explain the  dilated right ventricle. I would be concerned for either  unrecognized more significant pulmonic insufficiency (which seems  unlikely based on MRI and echo findings) or significant shunting  across the secundum ASD. The Qp/Qs using RV SV and LV SV is  elevated at 1.4/1 (but not markedly elevated). The dilated  pulmonary artery could be seen with significant PS, though the PS  seems to be only mild. High flow (such as from a shunt lesion)  also could lead to pulmonary artery dilation. Would consider TEE to  more closely assess the ASD.  ASSESSMENT AND PLAN:  OSTIUM SECUNDUM ASD The patient has a relatively large secundum ASD. His RV is dilated and this is suggestive of RV volume overload. ASD closure is clearly indicated. I have carefully reviewed his TEE and the ASD anatomy appears amenable to transcatheter closure. I   have reviewed the risks, indications, and alternatives to ASD closure with the patient and his family. They understand the risks include bleeding, infection, MI, stroke, device embolism, cardiac perforation, tamponade, and death. The risk of serious complication is approximately 1%. They agree to  proceed as planned. The patient will require general anesthesia because of his autism and inability to tolerate lying still. Will arrange TEE guidance for the procedure with Dr McLean.   I have recommended that he start ASA and plavix which will be required for a period of 6 months. Will perform a right heart catheterization at the time of the procedure to assess pulmonary pressures and degree of pulmonic stenosis which appears mild by echo criteria.  Dalaina Tates 11/20/2012 6:58 AM      

## 2012-11-24 NOTE — Anesthesia Preprocedure Evaluation (Addendum)
Anesthesia Evaluation  Patient identified by MRN, date of birth, ID band Patient awake    Reviewed: Allergy & Precautions, H&P , NPO status , Patient's Chart, lab work & pertinent test results  Airway Mallampati: II TM Distance: >3 FB Neck ROM: Full    Dental  (+) Teeth Intact   Pulmonary neg pulmonary ROS,    Pulmonary exam normal       Cardiovascular     Neuro/Psych PSYCHIATRIC DISORDERS negative neurological ROS     GI/Hepatic negative GI ROS, Neg liver ROS,   Endo/Other  negative endocrine ROS  Renal/GU negative Renal ROS     Musculoskeletal   Abdominal Normal abdominal exam  (+)   Peds  (+) Congenital Heart Disease Hematology   Anesthesia Other Findings   Reproductive/Obstetrics                          Anesthesia Physical Anesthesia Plan  ASA: III  Anesthesia Plan: General   Post-op Pain Management:    Induction: Intravenous  Airway Management Planned: LMA  Additional Equipment:   Intra-op Plan:   Post-operative Plan: Extubation in OR  Informed Consent: I have reviewed the patients History and Physical, chart, labs and discussed the procedure including the risks, benefits and alternatives for the proposed anesthesia with the patient or authorized representative who has indicated his/her understanding and acceptance.   Dental advisory given  Plan Discussed with: CRNA, Anesthesiologist and Surgeon  Anesthesia Plan Comments:         Anesthesia Quick Evaluation

## 2012-11-24 NOTE — Transfer of Care (Signed)
Immediate Anesthesia Transfer of Care Note  Patient: [name]  Procedure(s) Performed: Procedure(s) (LRB) with comments: TRANSESOPHAGEAL ECHOCARDIOGRAM W/O CARDIOVERSION (N/A) ATRIAL SEPTAL DEFECT (ASD) REPAIR (N/A)  Patient Location: PACU  Anesthesia Type:General  Level of Consciousness: awake, alert  and oriented  Airway & Oxygen Therapy: Patient Spontanous Breathing and Patient connected to nasal cannula oxygen  Post-op Assessment: Report given to PACU RN  Post vital signs: Reviewed and stable  Complications: No apparent anesthesia complications

## 2012-11-24 NOTE — Interval H&P Note (Signed)
History and Physical Interval Note:  11/24/2012 1:22 PM  [name]  has presented today for surgery, with the diagnosis of CP  The various methods of treatment have been discussed with the patient and family. After consideration of risks, benefits and other options for treatment, the patient has consented to  Procedure(s) (LRB) with comments: TRANSESOPHAGEAL ECHOCARDIOGRAM W/O CARDIOVERSION (N/A) ATRIAL SEPTAL DEFECT (ASD) REPAIR (N/A) as a surgical intervention .  The patient's history has been reviewed, patient examined, no change in status, stable for surgery.  I have reviewed the patient's chart and labs.  Questions were answered to the patient's satisfaction.     Tonny Bollman

## 2012-11-24 NOTE — Preoperative (Signed)
Beta Blockers   Reason not to administer Beta Blockers:Not Applicable 

## 2012-11-24 NOTE — Progress Notes (Signed)
*  PRELIMINARY RESULTS* Echocardiogram Echocardiogram Transesophageal has been performed.  Jeryl Columbia 11/24/2012, 3:37 PM

## 2012-11-24 NOTE — CV Procedure (Signed)
   Cardiac Catheterization Procedure Note  Name: [name] MRN: [id number] DOB: 10-Mar-1986  Procedure:Right heart catheterization, transcatheter ASD closure, Perclose Right Femoral   Indication: Secundum ASD  Procedural details: The right groin was prepped, draped, and anesthetized with 1% lidocaine. Using modified Seldinger technique, a 6 Fr [place] sheath was introduced into the right femoral vein. The vessel was preclosed with a Perclose device. A multipurpose catheter was used to record pressures throughout the right heart chambers, draw oxygen saturations from the SVC and PA, and to cross the ASD. It was difficult position wire into the pulmonary veins, but ultimately I was able to access the left upper pulmonary vein with a multipurpose catheter and a Glidewire. This was changed out for an Amplatz wire and a 24 mm balloon was advanced across the defect. 5000 units of unfractionated heparin had been administered. Guidance for the procedure was done with TEE. Dr. Shirlee Latch provided TEE imaging and interpretation (see his separate report). The defect was measured at 15 mm by fluoroscopic measurement and 16-17 mm by TEE measurement. We elected to use a 16 mm Amplatzer septal occluder device. The device was prepped under usual fashion. The device was delivered through an 8 [place] delivery sheath. The device was deployed but the confirmation of the device was not appropriate as the left disc did not flatten out appropriately. The device was retrieved and pulled out on the delivery cable. A new 16 mm device was prepped  and redeployed with good device position. Device position was guided by transesophageal echo and fluoroscopy. There was no residual shunt across the ASD by color flow. The device was released from the cable and a bubble study was performed that showed no right-to-left shunting. The right femoral vein was closed with the Perclose suture. The patient tolerated the entire procedure well.  There were no immediate procedural complications. The patient was transferred to the post catheterization recovery area for further monitoring.  Procedural Findings: Hemodynamics:  RA 7 RV 22/10 PA 20/12 with a mean of 16 Coronary Where wedge pressure mean of 10 Left atrial pressure A-wave 13, V wave 14, mean of 11  O2 Sats SVC 83 PA 91 AO 100  Cardiac output 7.9 L per minute  QP:QS 1.9:1   Final Conclusions:  Successful device closure of an ostium secundum ASD using a 16 mm Amplatzer septal occluder device  Recommendations: Will check a 2-D echocardiogram in the morning, PA and lateral chest x-ray, and EKG. The patient should continue with dual antiplatelet therapy using aspirin and Plavix for a total of 6 months. He should follow SBE prophylaxis for a total of 6 months.  Tonny Bollman 11/24/2012, 3:38 PM

## 2012-11-24 NOTE — CV Procedure (Signed)
Procedure: TEE  Indication: Guidance for ASD closure  Findings: Secundum ASD measuring 1.5 cm with left to right shunting.  After deployment of the closure device, there was no residual flow across the septum.  Bubble study was negative.    No complications.  See cath report for full details of the procedure.   Marca Ancona 11/24/2012

## 2012-11-24 NOTE — Anesthesia Postprocedure Evaluation (Signed)
Anesthesia Post Note  Patient: [name]  Procedure(s) Performed: Procedure(s) (LRB): TRANSESOPHAGEAL ECHOCARDIOGRAM W/O CARDIOVERSION (N/A) ATRIAL SEPTAL DEFECT (ASD) REPAIR (N/A)  Anesthesia type: general  Patient location: PACU  Post pain: Pain level controlled  Post assessment: Patient's Cardiovascular Status Stable  Last Vitals:  Filed Vitals:   11/24/12 1610  BP: 109/61  Pulse: 54  Temp: 36.1 C  Resp: 16    Post vital signs: Reviewed and stable  Level of consciousness: sedated  Complications: No apparent anesthesia complications

## 2012-11-24 NOTE — Progress Notes (Signed)
Mother, father and brother are at side. Verbalize understanding of need to remain still after procedure. Family will stay with patient post procedure.

## 2012-11-24 NOTE — Anesthesia Postprocedure Evaluation (Signed)
Anesthesia Post Note  Patient: [name]  Procedure(s) Performed: Procedure(s) (LRB): TRANSESOPHAGEAL ECHOCARDIOGRAM W/O CARDIOVERSION (N/A) ATRIAL SEPTAL DEFECT (ASD) REPAIR (N/A)  Anesthesia type: general  Patient location: PACU  Post pain: Pain level controlled  Post assessment: Patient's Cardiovascular Status Stable  Last Vitals:  Filed Vitals:   11/24/12 1610  BP: 109/61  Pulse: 54  Temp: 36.1 C  Resp: 16    Post vital signs: Reviewed and stable  Level of consciousness: sedated  Complications: No apparent anesthesia complications  

## 2012-11-25 ENCOUNTER — Other Ambulatory Visit: Payer: Self-pay | Admitting: Nurse Practitioner

## 2012-11-25 ENCOUNTER — Encounter (HOSPITAL_COMMUNITY): Payer: Self-pay | Admitting: *Deleted

## 2012-11-25 ENCOUNTER — Ambulatory Visit (HOSPITAL_COMMUNITY): Payer: Medicaid Other

## 2012-11-25 DIAGNOSIS — Q2111 Secundum atrial septal defect: Secondary | ICD-10-CM

## 2012-11-25 DIAGNOSIS — I059 Rheumatic mitral valve disease, unspecified: Secondary | ICD-10-CM

## 2012-11-25 DIAGNOSIS — Q211 Atrial septal defect: Secondary | ICD-10-CM

## 2012-11-25 MED ORDER — PNEUMOCOCCAL VAC POLYVALENT 25 MCG/0.5ML IJ INJ
0.5000 mL | INJECTION | INTRAMUSCULAR | Status: AC
  Administered 2012-11-25: 0.5 mL via INTRAMUSCULAR
  Filled 2012-11-25: qty 0.5

## 2012-11-25 NOTE — Progress Notes (Signed)
Utilization Review Completed Daizee Firmin J. Gerry Heaphy, RN, BSN, NCM [phone]  

## 2012-11-25 NOTE — Discharge Summary (Signed)
Patient ID: [name],  MRN: [id number], DOB/AGE: [birth year] 25 y.o.  Admit date: 11/24/2012 Discharge date: 11/25/2012  Primary Care Provider: Gildardo Cranker Primary Cardiologist: Golden Circle, MD  Discharge Diagnoses Principal Problem:  *ASD (atrial septal defect), Ostium Secundum  **S/P ASD closure this admission. Active Problems:  AUTISM  Ebstein's anomaly  Pulmonic stenosis, congenital  Allergies Allergies  Allergen Reactions  . Sulfa Antibiotics Other (See Comments)    Violent gastric bleeding   Procedures  TEE guided ASD Closure 11/24/2012  Successful device closure of an ostium secundum ASD using a 16 mm Amplatzer septal occluder device. _____________  2D Echocardiogram Study Conclusions  - Left ventricle: The cavity size was normal. Wall thickness   was normal. Systolic function was normal. The estimated   ejection fraction was in the range of 55% to 60%. Wall   motion was normal; there were no regional wall motion   abnormalities. - Mitral valve: Calcified annulus. Mild regurgitation. - Pulmonic valve: The findings are consistent with trivial   stenosis. Peak gradient: 18mm Hg (S).  Impressions:  - ASD closure device in place; ventricular noncompaction;   mild pulmonic stenosis; mildy displaced tricuspid valve;   cannot exclude Epstein's. _____________  History of Present Illness  27 y/o male with h/o chromosome 8p deletion syndrome with autism, moderate mental retardation, and congential heart disease (secundum ASD, LV noncompaction, pulmonic stenosis, and dilated PA).  He was recently seen by Dr. Shirlee Latch in the office for routine followup and subsequently underwent a 2-D echocardiogram which showed enlarged right ventricle with 1.5 cm secundum ASD and left-to-right shunt. With these findings, decision was made to pursue transesophageal echocardiogram guided ASD closure and patient initially followed up with Dr. Excell Seltzer and subsequently set up for outpatient  procedure.  Hospital Course  Patient presented to the [facility] cone catheterization laboratory where he underwent successful ASD Amplatzer septal occluder device. He tolerated procedure well and 2-D echocardiogram carried out this morning showed good placement of the device. He will remain on aspirin and Plavix for  6 months and we have arranged for followup echocardiogram in one month. He is being discharged home this afternoon in good condition.  Dscharge Vitals Blood pressure 117/64, pulse 80, temperature 98.2 F (36.8 C), temperature source Oral, resp. rate 18, height 6\' 2"  (1.88 m), weight 212 lb 15.4 oz (96.6 kg), SpO2 96.00%.  Filed Weights   11/24/12 1217 11/25/12 0038  Weight: 215 lb (97.523 kg) 212 lb 15.4 oz (96.6 kg)   Labs  None  Disposition  Pt is being discharged home today in good condition.  Follow-up Plans & Appointments  Follow-up Information    Follow up with Marca Ancona, MD. On 12/24/2012. (9:30 AM Echo; 10:30 Dr. Shirlee Latch)    Contact information:   [facility] [address] [place] [address] [phone]        Discharge Medications    Medication List     As of 11/25/2012  4:00 PM    TAKE these medications         aspirin EC 81 MG tablet   Take 1 tablet (81 mg total) by mouth daily.      cetirizine 10 MG tablet   Commonly known as: ZYRTEC   Take 10 mg by mouth daily.      clopidogrel 75 MG tablet   Commonly known as: PLAVIX   Take 1 tablet (75 mg total) by mouth daily.      fluticasone 50 MCG/ACT nasal spray  Commonly known as: FLONASE   Place 2 sprays into the nose daily.      hydrocortisone 1 % ointment   Apply 1 application topically 2 (two) times daily. Apply to face and arms      ketoconazole 2 % cream   Commonly known as: NIZORAL   Apply topically 2 (two) times daily. To the face and arms      Outstanding Labs/Studies  None  Duration of Discharge Encounter   Greater than 30 minutes including  physician time.  Signed, Nicolasa Ducking NP 11/25/2012, 4:00 PM

## 2012-11-25 NOTE — Progress Notes (Signed)
    Subjective:  No chest pain, dyspnea, or palpitations. The patient feels well.  Objective:  Vital Signs in the last 24 hours: Temp:  [96.9 F (36.1 C)-98.1 F (36.7 C)] 98.1 F (36.7 C) (01/21 0554) Pulse Rate:  [53-80] 80  (01/20 2130) Resp:  [0-35] 19  (01/21 0040) BP: (98-123)/(55-78) 123/67 mmHg (01/21 0040) SpO2:  [96 %-100 %] 96 % (01/21 0554) Weight:  [96.6 kg (212 lb 15.4 oz)-97.523 kg (215 lb)] 96.6 kg (212 lb 15.4 oz) (01/21 0038)  Intake/Output from previous day: 01/20 0701 - 01/21 0700 In: 390 [I.V.:390] Out: 10 [Blood:10]  Physical Exam: Pt is alert and oriented, NAD HEENT: normal Neck: JVP - normal, carotids 2+= without bruits Lungs: CTA bilaterally CV: RRR with a soft systolic ejection murmur at the left upper sternal border Abd: soft, NT, Positive BS, no hepatomegaly Ext: no C/C/E, distal pulses intact and equal. Right groin site is clear. Skin: warm/dry no rash  Lab Results: No results found for this basename: WBC:2,HGB:2,PLT:2 in the last 72 hours No results found for this basename: NA:2,K:2,CL:2,CO2:2,GLUCOSE:2,BUN:2,CREATININE:2 in the last 72 hours No results found for this basename: TROPONINI:2,CK,MB:2 in the last 72 hours  Cardiac Studies: 2-D echo pending  Tele: Sinus rhythm without arrhythmia, personally reviewed  Assessment/Plan:  Ostium secundum ASD, status post transcatheter closure: The patient is doing well. I have reviewed his PA and lateral chest x-ray demonstrating appropriate device position. He will have a 2-D echocardiogram this morning to rule out pericardial effusion and to evaluate device position. He can be discharged home after his echo is interpreted.  The patient should remain on dual antiplatelet therapy with aspirin and Plavix for 6 months. He should follow SBE prophylaxis for 6 months. He should avoid strenuous activity for 4 weeks. He can return to work on Thursday this week. He has a trip to [place] planned in a few  weeks and can followup with Dr. Shirlee Latch after he returns. He should have a followup limited echocardiogram when he returns for followup in one month and then again at one year.  Tonny Bollman, M.D. 11/25/2012, 6:49 AM

## 2012-12-03 ENCOUNTER — Telehealth: Payer: Self-pay | Admitting: *Deleted

## 2012-12-03 NOTE — Telephone Encounter (Signed)
Dr Shirlee Latch reviewed monitor done 11/13/12. No significant arrhythmias. Pt's mother notified.

## 2012-12-09 ENCOUNTER — Other Ambulatory Visit: Payer: Self-pay | Admitting: *Deleted

## 2012-12-09 MED ORDER — KETOCONAZOLE 2 % EX CREA: TOPICAL_CREAM | Freq: Two times a day (BID) | CUTANEOUS | Status: DC

## 2012-12-10 ENCOUNTER — Other Ambulatory Visit: Payer: Self-pay | Admitting: *Deleted

## 2012-12-10 DIAGNOSIS — Q211 Atrial septal defect: Secondary | ICD-10-CM

## 2012-12-10 DIAGNOSIS — R002 Palpitations: Secondary | ICD-10-CM

## 2012-12-10 MED ORDER — ASPIRIN EC 81 MG PO TBEC: 81.0000 mg | DELAYED_RELEASE_TABLET | Freq: Every day | ORAL | Status: DC

## 2012-12-22 ENCOUNTER — Other Ambulatory Visit: Payer: Self-pay | Admitting: *Deleted

## 2012-12-22 MED ORDER — CETIRIZINE HCL 10 MG PO TABS: 10.0000 mg | ORAL_TABLET | Freq: Every day | ORAL | Status: DC

## 2012-12-24 ENCOUNTER — Encounter: Payer: Self-pay | Admitting: *Deleted

## 2012-12-24 ENCOUNTER — Ambulatory Visit (INDEPENDENT_AMBULATORY_CARE_PROVIDER_SITE_OTHER): Payer: Medicaid Other | Admitting: Cardiology

## 2012-12-24 ENCOUNTER — Encounter: Payer: Self-pay | Admitting: Cardiology

## 2012-12-24 ENCOUNTER — Other Ambulatory Visit: Payer: Self-pay

## 2012-12-24 ENCOUNTER — Ambulatory Visit (HOSPITAL_COMMUNITY): Payer: Medicaid Other | Attending: Cardiology | Admitting: Radiology

## 2012-12-24 VITALS — BP 122/76 | HR 73 | Ht 74.0 in | Wt 213.0 lb

## 2012-12-24 DIAGNOSIS — Q211 Atrial septal defect: Secondary | ICD-10-CM

## 2012-12-24 DIAGNOSIS — I281 Aneurysm of pulmonary artery: Secondary | ICD-10-CM

## 2012-12-24 DIAGNOSIS — Q221 Congenital pulmonary valve stenosis: Secondary | ICD-10-CM

## 2012-12-24 DIAGNOSIS — I428 Other cardiomyopathies: Secondary | ICD-10-CM

## 2012-12-24 DIAGNOSIS — I059 Rheumatic mitral valve disease, unspecified: Secondary | ICD-10-CM | POA: Insufficient documentation

## 2012-12-24 DIAGNOSIS — Q2111 Secundum atrial septal defect: Secondary | ICD-10-CM | POA: Insufficient documentation

## 2012-12-24 NOTE — Progress Notes (Signed)
Patient ID: [name], male   DOB: 03/06/1986, 27 y.o.   MRN: [id number] PCP: Dr. 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  Patient was followed in the past by a pediatric cardiologist then by Dr. Reyes Ivan at [facility].  He had not had cardiology followup for several years prior to his initial appointment with me.  Echo in 2005 showed normal LV EF but questioned noncompaction towards the apex, mild pulmonic stenosis, small secundum ASD, and normal tricuspid valve.  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  Patient is relatively high-functioning for autism.  He reads and writes and has a relatively good memory per his mother.  He has trouble with social interactions.  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  No exertional dyspnea or chest pain.  He participates in the [employer] and skates, bowls, swims, and plays basketball.  No history of syncope or lightheadedness.  His heart rate races at times but only with exertion (no palpitations noted at rest or with mild exertion).     After a prior appointment, I had him do an echo. This was a difficult study.  LV EF was normal, there appeared to be mild PS with mean gradient 18 mmHg, the main PA was moderately dilated.  The atrial septum and RV were not well-visualized.  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  Given the ASD and RV dilation, TEE was done.  This confirmed at 1.5 cm secundum ASD with left to right flow and RV dilation.  He had percutaneous ASD closure in 1/14 due to enlarge RV.  This was successful.  Cath prior to closure showed Qp/Qs 1.9/1.  I reviewed the followup echo done  today.  The ASD closure device appears to be in appropriate position with no visible residual flow.   PMH: 1. Chromosome 8p deletion syndrome: Autism + cardiac anomalies.  He has pulmonic stenosis and a secundum ASD.  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  He is followed by a geneticist at [facility].  2. Congenital heart disease: Echo in 2005 showed EF 55-60% with ? 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  Echo (10/13) was a difficult study with EF 60-65%, mild MR, mild PS peak gradient 18 mmHg, dilated main PA.  Cardiac MRI (10/13) with LV EF 55%, prominent trabeculations LV apex suggestive of noncompaction, minimal PI but suspect mild PS, moderate RV dilation with EF 45% (RV EDVi 155 ml/m2), main PA dilated to 4.6 x 4.4 cm, secundum ASD, hard to tell size on MRI, Qp/Qs by MRI was 1.4/1.  TEE (12/13): EF 55%, LV noncompaction, RV mild-moderately dilated with normal systolic function, 1.5 cm secundum ASD with left to right flow, no significant PI, unable to measure PV gradient but does not look like significant PS.  Summary:  - Mild PS - Secundum ASD with dilated RV.  ASD was closed percutaneously on  1/14.  Qp/Qs on cath prior to procedure was 1.9/1.   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 Autism with moderate mental retardation.  4. Allergic rhinitis  SH: Living in group home and participating in a day program.  Mother very involved with his care.  Nonsmoker, no drugs.  FH: Mother with heart murmur.   ROS: All systems reviewed and negative except as per HPI.   Current Outpatient Prescriptions  Medication Sig Dispense Refill  . aspirin EC 81 MG tablet Take 1 tablet (81 mg total) by mouth daily.  90 tablet  3  . cetirizine (ZYRTEC) 10 MG  tablet Take 1 tablet (10 mg total) by mouth daily.  30 tablet  5  . clopidogrel (PLAVIX) 75 MG tablet Take 1 tablet (75 mg total) by mouth daily.  90 tablet  3  . fluticasone (FLONASE) 50 MCG/ACT nasal spray Place 2 sprays into the nose daily.      . hydrocortisone 1 % ointment Apply 1 application topically 2 (two) times daily. Apply to face and arms      . ketoconazole (NIZORAL) 2 % cream Apply topically 2 (two) times daily. To the face and arms  30 g  2   No current facility-administered medications for this visit.    BP 122/76  Pulse 73  Ht 6\' 2"  (1.88 m)  Wt 213 lb (96.616 kg)  BMI 27.34 kg/m2  SpO2 98% General: NAD Neck: No JVD, no thyromegaly or thyroid nodule.  Lungs: Clear to auscultation bilaterally with normal respiratory effort. CV: Nondisplaced PMI.  Heart regular S1/S2, no S3/S4, fixed split S2, 2/6 early SEM LUSB.  No peripheral edema.  No carotid bruit.  Normal pedal pulses.  Abdomen: Soft, nontender, no hepatosplenomegaly, no distention.  Skin: Intact without lesions or rashes.  Neurologic: Alert and oriented x 3.  Psych: Normal affect. Extremities: No clubbing or cyanosis.  HEENT: Normal.   Assessment/Plan: 1. Secundum ASD: Given enlarged RV, this was closed percutaneously in 1/14.  Today's echo shows that the ASD closure device is in place with no visible residual shunt.  Continue Plavix x 6 months.  Continue ASA 81 long-term.  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  LV EF is preserved.   3. 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  This can be seen with pulmonic stenosis (appears to be mild in this patient).  High flow from ASD may be a major contributor.  It is possible that with the ASD closed, the PA will not enlarge further or potentially even decrease in size given decrease in pulmonary flow.  Will repeat MRA chest in 10/14 to follow the PA.   4. Pulmonic stenosis: Mild by echo.  Visually appears mild.  No significant pulmonic  insufficiency was visualized.   Marca Ancona 12/24/2012 11:37 PM

## 2012-12-24 NOTE — Patient Instructions (Signed)
Continue Plavix for 6 months after procedure done 11/24/12--until July 20,2014.  Continue aspirin.  Your physician wants you to follow-up in: October 2014 with Dr Shirlee Latch. You will receive a reminder letter in the mail two months in advance. If you don't receive a letter, please call our office to schedule the follow-up appointment.    You should have an MRA of your chest in October 2014 a few days before you see Dr Shirlee Latch.

## 2012-12-24 NOTE — Progress Notes (Signed)
Echocardiogram performed.  

## 2013-01-13 ENCOUNTER — Telehealth: Payer: Self-pay | Admitting: Cardiology

## 2013-01-13 NOTE — Telephone Encounter (Signed)
MRA chest order in system if not needs please have provider remove order so they don't call pt if order is needed please contact them to confirm  so she can inform the pt of and appt date and time

## 2013-01-13 NOTE — Telephone Encounter (Signed)
Radiology calling because they have an MRA on this pt in their work que and they want to know if he still needs it.  After reading Dr Alford Highland last ov note, it appears pt needs to have an mra in October, 2014.  This was relayed to the radiology dept.

## 2013-02-04 ENCOUNTER — Other Ambulatory Visit: Payer: Self-pay | Admitting: Family Medicine

## 2013-02-05 ENCOUNTER — Encounter: Payer: Self-pay | Admitting: Family Medicine

## 2013-02-05 ENCOUNTER — Ambulatory Visit (INDEPENDENT_AMBULATORY_CARE_PROVIDER_SITE_OTHER): Payer: Medicaid Other | Admitting: Family Medicine

## 2013-02-05 VITALS — BP 130/80 | HR 67 | Ht 74.0 in | Wt 215.0 lb

## 2013-02-05 DIAGNOSIS — Q211 Atrial septal defect: Secondary | ICD-10-CM

## 2013-02-05 NOTE — Assessment & Plan Note (Signed)
Pt doing well s/p closure of ASD.  Continue Plavix x 6 months. Continue ASA 81 long-term.  Will f/u with cards q 6 months for now and will need MRA of PA due to possible dilatation per echo.

## 2013-02-05 NOTE — Progress Notes (Signed)
[name] is a 27 y.o. male who presents today for f/u s/p ASD repair 11/25/12 by Dr. Shirlee Latch [facility] cards).    Pt doing well since surgery without complaints of CP, SOB, edema, palpitations.  Pt has f/u with cardiology since then with repeat Echo showing mild PS and dilated PA.  His closure device was in place without complications and he was told to continue on Plavix x 6 months and ASA indefinitely.    Patient is relatively high-functioning for autism.  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 He participates in the [employer] and skates, bowls, swims, and plays basketball. No history of syncope or lightheadedness.    Past Medical History  Diagnosis Date  . UTI (urinary tract infection), uncomplicated 12/2010    Tx c Bactrim  . Pulmonary artery stenosis, main   . Autism disorder   . Mental retardation   . ASD (atrial septal defect)     a. 11/2012 ASD closure of ostium secundum ASD w/ 16mm Amplatzer septal occluder device;  b. 11/2012 Echo: EF 55-60%, mild MR, Triv PS, ASD closure device in place.    History  Smoking status  . Never Smoker   Smokeless tobacco  . Never Used    No family history on file.  Current Outpatient Prescriptions on File Prior to Visit  Medication Sig Dispense Refill  . aspirin EC 81 MG tablet Take 1 tablet (81 mg total) by mouth daily.  90 tablet  3  . cetirizine (ZYRTEC) 10 MG tablet Take 1 tablet (10 mg total) by mouth daily.  30 tablet  5  . clopidogrel (PLAVIX) 75 MG tablet Take 1 tablet (75 mg total) by mouth daily.  90 tablet  3  . fluticasone (FLONASE) 50 MCG/ACT nasal spray Place 2 sprays into the nose daily.      Marland Kitchen GNP HYDROCORTISONE MAX ST 1 % ointment APPLY TO AFFECTED AREA TWICE A DAY (FOR FACE AND ARMS)  454 g  2  . ketoconazole (NIZORAL) 2 % cream Apply topically 2 (two) times daily. To the face and arms  30 g  2   No current facility-administered medications on file prior to  visit.    ROS: Per HPI.  All other systems reviewed and are negative.   Physical Exam Filed Vitals:   02/05/13 1514  BP: 130/80  Pulse: 67    Physical Examination: General appearance - alert, well appearing, and in no distress Mental status - normal mood, behavior, speech, dress, motor activity, and thought processes Chest - clear to auscultation, no wheezes, rales or rhonchi, symmetric air entry Heart - normal rate and regular rhythm, S1 and S2 normal, +2/6 SEM LUSB

## 2013-02-05 NOTE — Patient Instructions (Signed)
Allergic Rhinitis  Allergic rhinitis is when the mucous membranes in the nose respond to allergens. Allergens are particles in the air that cause your body to have an allergic reaction. This causes you to release allergic antibodies. Through a chain of events, these eventually cause you to release histamine into the blood stream (hence the use of antihistamines). Although meant to be protective to the body, it is this release that causes your discomfort, such as frequent sneezing, congestion and an itchy runny nose.    CAUSES    The pollen allergens may come from grasses, trees, and weeds. This is seasonal allergic rhinitis, or "hay fever." Other allergens cause year-round allergic rhinitis (perennial allergic rhinitis) such as house dust mite allergen, pet dander and mold spores.    SYMPTOMS     Nasal stuffiness (congestion).   Runny, itchy nose with sneezing and tearing of the eyes.   There is often an itching of the mouth, eyes and ears.  It cannot be cured, but it can be controlled with medications.  DIAGNOSIS    If you are unable to determine the offending allergen, skin or blood testing may find it.  TREATMENT     Avoid the allergen.   Medications and allergy shots (immunotherapy) can help.   Hay fever may often be treated with antihistamines in pill or nasal spray forms. Antihistamines block the effects of histamine. There are over-the-counter medicines that may help with nasal congestion and swelling around the eyes. Check with your caregiver before taking or giving this medicine.  If the treatment above does not work, there are many new medications your caregiver can prescribe. Stronger medications may be used if initial measures are ineffective. Desensitizing injections can be used if medications and avoidance fails. Desensitization is when a patient is given ongoing shots until the body becomes less sensitive to the allergen. Make sure you follow up with your caregiver if problems continue.   SEEK MEDICAL CARE IF:     You develop fever (more than 100.5 F (38.1 C).   You develop a cough that does not stop easily (persistent).   You have shortness of breath.   You start wheezing.   Symptoms interfere with normal daily activities.  Document Released: 07/17/2001 Document Revised: 01/14/2012 Document Reviewed: 01/26/2009  ExitCare Patient Information 2013 [employer].

## 2013-03-05 ENCOUNTER — Other Ambulatory Visit: Payer: Self-pay | Admitting: Family Medicine

## 2013-03-05 ENCOUNTER — Other Ambulatory Visit: Payer: Self-pay | Admitting: Cardiovascular Disease

## 2013-04-23 ENCOUNTER — Encounter (HOSPITAL_COMMUNITY): Payer: Self-pay | Admitting: Emergency Medicine

## 2013-04-23 ENCOUNTER — Emergency Department (HOSPITAL_COMMUNITY)
Admission: EM | Admit: 2013-04-23 | Discharge: 2013-04-23 | Disposition: A | Discharge status: Home / Self Care | Payer: Medicaid Other | Attending: Emergency Medicine | Admitting: Emergency Medicine

## 2013-04-23 DIAGNOSIS — Z79899 Other long term (current) drug therapy: Secondary | ICD-10-CM | POA: Insufficient documentation

## 2013-04-23 DIAGNOSIS — F79 Unspecified intellectual disabilities: Secondary | ICD-10-CM | POA: Insufficient documentation

## 2013-04-23 DIAGNOSIS — Z7982 Long term (current) use of aspirin: Secondary | ICD-10-CM | POA: Insufficient documentation

## 2013-04-23 DIAGNOSIS — R040 Epistaxis: Secondary | ICD-10-CM | POA: Insufficient documentation

## 2013-04-23 DIAGNOSIS — Z8679 Personal history of other diseases of the circulatory system: Secondary | ICD-10-CM | POA: Insufficient documentation

## 2013-04-23 DIAGNOSIS — F840 Autistic disorder: Secondary | ICD-10-CM | POA: Insufficient documentation

## 2013-04-23 DIAGNOSIS — Z87440 Personal history of urinary (tract) infections: Secondary | ICD-10-CM | POA: Insufficient documentation

## 2013-04-23 NOTE — ED Provider Notes (Signed)
Medical screening examination/treatment/procedure(s) were performed by non-physician practitioner and as supervising physician I was immediately available for consultation/collaboration.  Geoffery Lyons, MD 04/23/13 [phone]

## 2013-04-23 NOTE — ED Notes (Signed)
No bleeding at this time. 

## 2013-04-23 NOTE — ED Notes (Signed)
Nose bleed, onset this am around 0630. Bleeding controlled at this time. Pt is AUTISTIC.

## 2013-04-23 NOTE — ED Provider Notes (Signed)
History     CSN: [id number]  Arrival date & time 04/23/13  8295   First MD Initiated Contact with Patient 04/23/13 [phone]      CC: Nosebleed  (Consider location/radiation/quality/duration/timing/severity/associated sxs/prior treatment) HPI Comments: Patient with h/o autism, on plavix 2/2 ASD repair 01/14 [place] -- presents with nosebleed that began acutely at 6:30 AM. It was treated with tissue packing. Bleeding slowed. No lightheadedness or syncope. Bleeding is mild. Family is concerned that given history of repair that he needs antibiotic prophylaxis. Course is improving. Nothing makes symptoms better or worse.   The history is provided by medical records, a parent and the patient.    Past Medical History  Diagnosis Date  . UTI (urinary tract infection), uncomplicated 12/2010    Tx c Bactrim  . Pulmonary artery stenosis, main   . Autism disorder   . Mental retardation   . ASD (atrial septal defect)     a. 11/2012 ASD closure of ostium secundum ASD w/ 16mm Amplatzer septal occluder device;  b. 11/2012 Echo: EF 55-60%, mild MR, Triv PS, ASD closure device in place.    Past Surgical History  Procedure Laterality Date  . Tonsillectomy    . Tee without cardioversion  10/23/2012    Procedure: TRANSESOPHAGEAL ECHOCARDIOGRAM (TEE);  Surgeon: Laurey Morale, MD;  Location: [facility] ENDOSCOPY;  Service: Cardiovascular;  Laterality: N/A;  Dr. requesting [name]. come over to push propofol/ pt is hard to sedate  . Cardiac surgery      No family history on file.  History  Substance Use Topics  . Smoking status: Never Smoker   . Smokeless tobacco: Never Used  . Alcohol Use: No      Review of Systems  Constitutional: Negative for fever.  HENT: Positive for nosebleeds. Negative for sore throat and rhinorrhea.   Eyes: Negative for redness.  Respiratory: Negative for cough.   Cardiovascular: Negative for chest pain.  Gastrointestinal: Negative for nausea, vomiting, abdominal pain and  diarrhea.  Genitourinary: Negative for dysuria.  Musculoskeletal: Negative for myalgias.  Skin: Negative for rash.  Neurological: Negative for syncope and headaches.    Allergies  Sulfa antibiotics  Home Medications   Current Outpatient Rx  Name  Route  Sig  Dispense  Refill  . aspirin EC 81 MG tablet   Oral   Take 1 tablet (81 mg total) by mouth daily.   90 tablet   3   . cetirizine (ZYRTEC) 10 MG tablet   Oral   Take 1 tablet (10 mg total) by mouth daily.   30 tablet   5   . clopidogrel (PLAVIX) 75 MG tablet      Take 1 tablet (75 mg total) by mouth daily.   30 tablet   6   . fluticasone (FLONASE) 50 MCG/ACT nasal spray   Nasal   Place 2 sprays into the nose daily.         Marland Kitchen GNP HYDROCORTISONE MAX ST 1 % ointment      APPLY TO AFFECTED AREA TWICE A DAY (FOR FACE AND ARMS)   454 g   2   . ketoconazole (NIZORAL) 2 % cream      APPLY TOPICALLY TWO   (TWO) TIMES DAILY. TO THE FACE AND ARMS   60 g   2     BP 106/72  Pulse 74  Temp(Src) 97.4 F (36.3 C) (Oral)  SpO2 99%  Physical Exam  Nursing note and vitals reviewed. Constitutional: He appears well-developed and well-nourished.  HENT:  Head: Normocephalic and atraumatic.  Nose: Epistaxis (stopped, clot anterior left septum) is observed.  Eyes: Conjunctivae are normal.  Neck: Normal range of motion. Neck supple.  Pulmonary/Chest: No respiratory distress.  Neurological: He is alert.  Skin: Skin is warm and dry.  Psychiatric: He has a normal mood and affect.    ED Course  Procedures (including critical care time)  Labs Reviewed - No data to display No results found.   1. Epistaxis     9:47 AM Patient seen and examined. Bleeding currently controlled.   Vital signs reviewed and are as follows: Filed Vitals:   04/23/13 0943  BP: 106/72  Pulse: 74  Temp: 97.4 F (36.3 C)   10:59 AM Spoke with Dr. Antoine Poche who states no prophylaxis. Patient/parent informed.   Counseled on procedure  to follow with nosebleed.     MDM  Epistaxis on Plavix. No antibiotic prophylaxis indicated. Bleeding controlled. Do not suspect excessive blood loss.        Renne Crigler, PA-C 04/23/13 1101

## 2013-04-23 NOTE — Discharge Instructions (Signed)
Please read and follow all provided instructions.  Your diagnoses today include:  1. Epistaxis     Tests performed today include:  Vital signs. See below for your results today.   Medications prescribed:   None  Home care instructions:  Read the educational materials provided and follow any instructions contained in this packet.  If your nosebleed happens again: Pinch your nose and hold for 15 minutes without letting go.  If this does not stop the bleeding, pinch and hold for another 15 minutes.  If it continues to bleed after this, please come to the Emergency Department or see your family doctor.   Follow-up instructions: Please follow-up with your primary care provider in the next 3 days for further evaluation of your symptoms. If you do not have a primary care doctor -- see below for referral information.   Return instructions:   Please return to the Emergency Department if you experience worsening symptoms.   Please return if you have any other emergent concerns.  Additional Information:  Your vital signs today were: BP 106/72   Pulse 74   Temp(Src) 97.4 F (36.3 C) (Oral)   SpO2 99% If your blood pressure (BP) was elevated above 135/85 this visit, please have this repeated by your doctor within one month. -------------- RESOURCE GUIDE  Chronic Pain Problems:  [facility]:  [phone]  Patients need to be referred by their primary care doctor  Insufficient Money for Medicine:  [employer]:     call "211"  [facility]:  212-432-8588  No Primary Care Doctor:  To locate a primary care doctor that accepts your insurance or provides certain services:  [facility] :   [phone]  Physician Referral Service:  [phone]  Agencies that provide inexpensive medical care:  [facility]:   [phone]  [facility]:   [phone]  Women's Clinic:     [phone]  Planned Parenthood:    [phone]  [place]  [facility]:     [phone]  Medicaid-accepting [facility] Providers:  [facility]  [address], [phone], Mon-Fri 9am-7pm, Sat 9am-1pm  [facility]  [address] [place], [address] [place], [phone]  [facility]  [address], Suite [place], [phone]  [facility]  [address] [phone]  Renaye Rakers  [address], [phone]  Only accepts [place] [employer] patients after they have their name  applied to their card  Self Pay (no insurance) in [facility]:  Sickle Cell Patients: Dr. Willey Blade, [facility] Internal Medicine  [address] [place], [phone]  [facility]  [address] [place], [phone]  [facility] [place]  [facility] [address], [facility] - [address]  [phone], Mon-Fri 9am-7pm, Sat 9am-1pm  [facility]  [address], [phone]  [facility]  [address], [phone]  Dr. Julio Sicks  [address], [place], [phone]  [facility]  [address], [phone]  [facility]  [address], [phone]  [address] [phone]  [facility]  [address] [place], [phone], 1st & 3rd Saturday every month, 10am-1pm  Strategies for finding a Primary Care Provider:  1) Find a Doctor and Pay Out of Pocket Although you won't  have to find out who is covered by your insurance plan, it is a good idea to ask around and get recommendations. You will then need to call the office and see if the doctor you have chosen will accept you as a new patient and what types of options they offer for patients who are self-pay. Some doctors offer discounts or will set up payment plans for their patients who do not have insurance, but you will need to ask so you aren't  surprised when you get to your appointment.  2) Contact Your Local Health Department Not all health departments have doctors that can see patients for sick visits, but many do, so it is worth a call to see if yours does. If you don't know where your local health department is, you can check in your phone book. The CDC also has a tool to help you locate your state's health department, and many state websites also have listings of all of their local health departments.  3) Find a Walk-in Clinic If your illness is not likely to be very severe or complicated, you may want to try a walk in clinic. These are popping up all over the country in pharmacies, drugstores, and shopping centers. They're usually staffed by nurse practitioners or physician assistants that have been trained to treat common illnesses and complaints. They're usually fairly quick and inexpensive. However, if you have serious medical issues or chronic medical problems, these are probably not your best option  STD Testing:  [facility] [place] [facility]  [address] [place], phone [phone] or [phone]    Monday - Friday, call for an appointment  [facility] of [facility], [place] [facility]  [address], [place], phone [phone] or [phone]   Monday - Friday, call for an appointment  Abuse/Neglect:  [facility] Child Abuse Hotline:  604-122-0071  [facility] Child Abuse Hotline: 786-158-8201 (After Hours)  Emergency Shelter:  [facility] (210)679-4792  Maternity Homes:  Room at the [place] [facility]: 585-486-0933  [facility]: 581-301-1621  MRSA Hotline:   (340) 624-4486   [facility]  [address] [place]  [phone]   [place]  [address] [place], [place]  [phone]   [facility]  [address],  [place]  [phone]   [facility]  [phone]   [facility]  [phone]   [facility] in [place]  [address]  [phone], [facility] Child Abuse Hotline  518-181-0022  838-120-7918 (After Hours)   Behavioral Health Services  Substance Abuse Resources:  Alcohol and Drug Services:     337-085-9255  [facility]:   [phone]  [facility]:     [phone]  [facility]:      [phone]  Residential & Outpatient Substance Abuse Program: 801-796-3178  Psychological Services:  [facility]:   [phone]  [facility] Services:    [phone]  [facility]  [address], [place]  ACCESS LINE: (260) 623-3874 or [phone]  [employer]  Dental Assistance: If unable to pay or uninsured, contact:  [facility] or [facility]. to become qualified for the adult dental clinic.  Patients with Medicaid  [facility]  [phone] [address], [phone]  [address] [phone]  If unable to pay, or uninsured: contact [facility] Department [phone] in [place], [phone] in [facility]) to become qualified for the adult dental clinic  Other Low-Cost Community Dental Services:  [facility]  [address] [place], [place], [place], [address]  [phone], [phone]  2nd and 4th Thursday of the month at [facility]  [facility]  [address] [place], [place], [place], [address]  [phone]  [facility]  [address] [place], [place], [address]  [phone]  [facility] Health Department  [phone]  [facility] Health Department  [phone]  [facility] Health Department  [phone]

## 2013-04-29 ENCOUNTER — Other Ambulatory Visit: Payer: Self-pay | Admitting: Family Medicine

## 2013-05-11 ENCOUNTER — Ambulatory Visit (INDEPENDENT_AMBULATORY_CARE_PROVIDER_SITE_OTHER): Payer: Medicaid Other | Admitting: Physician Assistant

## 2013-05-11 ENCOUNTER — Encounter: Payer: Self-pay | Admitting: Physician Assistant

## 2013-05-11 VITALS — BP 129/79 | HR 80 | Ht 73.0 in | Wt 227.0 lb

## 2013-05-11 DIAGNOSIS — Q221 Congenital pulmonary valve stenosis: Secondary | ICD-10-CM

## 2013-05-11 DIAGNOSIS — Q211 Atrial septal defect: Secondary | ICD-10-CM

## 2013-05-11 DIAGNOSIS — I428 Other cardiomyopathies: Secondary | ICD-10-CM

## 2013-05-11 DIAGNOSIS — I281 Aneurysm of pulmonary artery: Secondary | ICD-10-CM

## 2013-05-11 NOTE — Progress Notes (Signed)
[address] [place], [place]  [address] Phone: [phone] Fax:  [phone]  Date:  05/11/2013   ID:  [name], DOB 07-14-1986, MRN [id number]  PCP:  Gildardo Cranker, DO  Cardiologist:  Dr. Marca Ancona     History of Present Illness: [name] is a 27 y.o. male who returns today for unclear reasons.  F/u was to be done in 08/2013 with Dr. Marca Ancona.    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  Patient was followed in the past by a pediatric cardiologist and then by Dr. Reyes Ivan at [facility] prior to establishing with Dr. Shirlee Latch.  Echo in 2005 showed normal LVEF but questioned noncompaction towards the apex, mild pulmonic stenosis, small secundum ASD, and normal tricuspid valve.  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  Patient is relatively high-functioning for autism.  He reads and writes and has a relatively good memory per his mother.  He has trouble with social interactions.  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  No exertional dyspnea or chest pain.  He participates in the [employer] and skates, bowls, swims, and plays basketball.  No history of syncope or lightheadedness.  His heart rate races at times but only with exertion (no palpitations noted at rest or with mild exertion).     After a prior appointment, Dr. Shirlee Latch had him do an echo. This was a difficult study.  LVEF was normal, there appeared to be mild PS with mean gradient 18 mmHg, the main PA was moderately dilated.  The atrial septum and RV were not well-visualized.  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  Given the ASD and RV dilation, TEE was done.  This  confirmed at 1.5 cm secundum ASD with left to right flow and RV dilation.  He had percutaneous ASD closure in 1/14 due to enlarged RV.  This was successful.  Cath prior to closure showed Qp/Qs 1.9/1.  Recent f/u echo demonstrates that the ASD closure device appears to be in appropriate position with no visible residual flow.   He continues to participate in the day program and [employer].  He is here with a co-worker.  His job includes shredding paper.  He denies syncope or difficulty breathing.    Wt Readings from Last 3 Encounters:  02/05/13 215 lb (97.523 kg)  12/24/12 213 lb (96.616 kg)  11/25/12 212 lb 15.4 oz (96.6 kg)    Past Medical History: 1. Chromosome 8p deletion syndrome: Autism + cardiac anomalies.  He has pulmonic stenosis and a secundum ASD.  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  He is followed by a geneticist at [facility].  2. Congenital heart disease: Echo in 2005 showed EF 55-60% with ? 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  Echo (10/13) was a difficult study with EF 60-65%, mild MR, mild PS peak gradient 18 mmHg, dilated  main PA.  Cardiac MRI (10/13) with LV EF 55%, prominent trabeculations LV apex suggestive of noncompaction, minimal PI but suspect mild PS, moderate RV dilation with EF 45% (RV EDVi 155 ml/m2), main PA dilated to 4.6 x 4.4 cm, secundum ASD, hard to tell size on MRI, Qp/Qs by MRI was 1.4/1.  TEE (12/13): EF 55%, LV noncompaction, RV mild-moderately dilated with normal systolic function, 1.5 cm secundum ASD with left to right flow, no significant PI, unable to measure PV gradient but does not look like significant PS.  Summary:  - Mild PS - Secundum ASD with dilated RV.  ASD was closed percutaneously on 1/14.  Qp/Qs on cath prior to  procedure was 1.9/1.   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 Autism with moderate mental retardation.  4. Allergic rhinitis   Current Outpatient Prescriptions  Medication Sig Dispense Refill  . aspirin EC 81 MG tablet Take 1 tablet (81 mg total) by mouth daily.  90 tablet  3  . cetirizine (ZYRTEC) 10 MG tablet Take 1 tablet (10 mg total) by mouth daily.  30 tablet  5  . clopidogrel (PLAVIX) 75 MG tablet Take 1 tablet (75 mg total) by mouth daily.  30 tablet  6  . fluticasone (FLONASE) 50 MCG/ACT nasal spray Place 2 sprays into the nose daily.      . fluticasone (FLONASE) 50 MCG/ACT nasal spray INSTILL TWO   SPRAYS IN EACH NOSTRIL DAILY  16 g  5  . GNP HYDROCORTISONE MAX ST 1 % ointment APPLY TO AFFECTED AREA TWICE A DAY (FOR FACE AND ARMS)  454 g  2  . ketoconazole (NIZORAL) 2 % cream APPLY TOPICALLY TWO   (TWO) TIMES DAILY. TO THE FACE AND ARMS  60 g  2   No current facility-administered medications for this visit.    Allergies:    Allergies  Allergen Reactions  . Sulfa Antibiotics Other (See Comments)    Violent gastric bleeding    Social History:  The patient  reports that he has never smoked. He has never used smokeless tobacco. He reports that he does not drink alcohol or use illicit drugs.   ROS:  Please see the history of present illness.      All other systems reviewed and negative.   PHYSICAL EXAM: VS:  BP 129/79  Pulse 80  Ht 6\' 1"  (1.854 m)  Wt 227 lb (102.967 kg)  BMI 29.96 kg/m2 Well nourished, well developed, in no acute distress HEENT: normal Neck: no JVD Cardiac:  normal S1, split S2; RRR; short, early 1-2/6 systolic murmur at LUSB Lungs:  clear to auscultation bilaterally, no wheezing, rhonchi or rales Abd: soft  Ext: no edema Skin: warm and dry Neuro:  CNs 2-12 intact, no focal abnormalities noted  EKG:  NSR, HR 80, LAD, NSSTTW changes, no change from prior tracings     ASSESSMENT AND PLAN:  1. Secundum ASD:   S/p closure in 11/2012.  Follow up echo demonstrated that device was in proper position.  Patient reminded today that he can stop Plavix after 7/20.  He will remain on ASA. 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  Repeat planned in 08/2013.   4. Disposition:  F/u with Dr. Marca Ancona in 08/2013 after MRA.    Signed, [name], PA-C  05/11/2013 8:34 AM

## 2013-05-11 NOTE — Patient Instructions (Addendum)
Continue to take Plavis until 05/24/2013.  You can stop it after this date.  Do not stop taking Aspirin.  Continue taking Aspirin.  Schedule chest MRA some time in October.  Follow up with Dr. Marca Ancona 1-2 weeks after MRA.

## 2013-06-01 ENCOUNTER — Telehealth: Payer: Self-pay | Admitting: Cardiovascular Disease

## 2013-06-01 DIAGNOSIS — Q211 Atrial septal defect: Secondary | ICD-10-CM

## 2013-06-01 MED ORDER — ASPIRIN EC 81 MG PO TBEC: 81.0000 mg | DELAYED_RELEASE_TABLET | Freq: Every day | ORAL | Status: DC

## 2013-06-01 NOTE — Telephone Encounter (Signed)
New problem   Per pts mom-pt is in a group home and needs a hand written prescription so pt can take aspirin

## 2013-06-01 NOTE — Telephone Encounter (Signed)
Pt's mother called,because  in order for pt to get aspirin 81 mg once a day in the home facility thathe lives pt needs a prescription. A written prescription for Aspirin 81 mg one tablet  By mouth once a day; faxed  to pt's mother Acel Natzke to [phone].

## 2013-06-16 ENCOUNTER — Other Ambulatory Visit: Payer: Self-pay | Admitting: Family Medicine

## 2013-08-05 ENCOUNTER — Ambulatory Visit (HOSPITAL_COMMUNITY)
Admission: RE | Admit: 2013-08-05 | Discharge: 2013-08-05 | Disposition: A | Discharge status: Home / Self Care | Payer: Medicaid Other | Source: Ambulatory Visit | Attending: Cardiology | Admitting: Cardiology

## 2013-08-05 DIAGNOSIS — Q211 Atrial septal defect: Secondary | ICD-10-CM

## 2013-08-05 DIAGNOSIS — I281 Aneurysm of pulmonary artery: Secondary | ICD-10-CM | POA: Insufficient documentation

## 2013-08-05 MED ORDER — GADOBENATE DIMEGLUMINE 529 MG/ML IV SOLN
20.0000 mL | Freq: Once | INTRAVENOUS | Status: AC | PRN
  Administered 2013-08-05: 20 mL via INTRAVENOUS

## 2013-08-10 ENCOUNTER — Ambulatory Visit (INDEPENDENT_AMBULATORY_CARE_PROVIDER_SITE_OTHER): Payer: Medicaid Other | Admitting: *Deleted

## 2013-08-10 DIAGNOSIS — Z23 Encounter for immunization: Secondary | ICD-10-CM

## 2013-08-25 ENCOUNTER — Ambulatory Visit (INDEPENDENT_AMBULATORY_CARE_PROVIDER_SITE_OTHER): Payer: Medicaid Other | Admitting: Nurse Practitioner

## 2013-08-25 ENCOUNTER — Encounter: Payer: Self-pay | Admitting: Nurse Practitioner

## 2013-08-25 VITALS — BP 120/80 | HR 76 | Ht 74.0 in | Wt 225.0 lb

## 2013-08-25 DIAGNOSIS — Q211 Atrial septal defect: Secondary | ICD-10-CM

## 2013-08-25 DIAGNOSIS — I281 Aneurysm of pulmonary artery: Secondary | ICD-10-CM

## 2013-08-25 NOTE — Patient Instructions (Signed)
Continue with your current medicines  See Dr. Shirlee Latch in 6 months  Call the [facility] office at [phone] if you have any questions, problems or concerns.

## 2013-08-25 NOTE — Progress Notes (Signed)
[name] Date of Birth: 01-26-1986 Medical Record #[id number]  History of Present Illness: [name] is seen back today for a 4 month check. Seen for Dr. Shirlee Latch. Last seen by Mark Newcomer PA in July. F/u was to be done in 08/2013 with Dr. Marca Ancona - no room on the schedule.   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 Patient was followed in the past by a pediatric cardiologist and then by Dr. Reyes Salazar at [facility] prior to establishing with Dr. Shirlee Latch. Echo in 2005 showed normal LVEF but questioned noncompaction towards the apex, mild pulmonic stenosis, small secundum ASD, and normal tricuspid valve. 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   Patient is relatively high-functioning for autism. He reads and writes and has a relatively good memory per his mother. He has trouble with social interactions. 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 No exertional dyspnea or chest pain. He participates in the [employer] and skates, bowls, swims, and plays basketball. No history of syncope or lightheadedness. His heart rate races at times but only with exertion (no palpitations noted at rest or with mild exertion).   After a past appointment, Dr. Shirlee Latch had him do an echo. This was a difficult study. LVEF was normal, there appeared to be mild PS with mean gradient 18 mmHg, the main PA was moderately dilated. The atrial septum and RV were not well-visualized. 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 Given the ASD and RV dilation, TEE was done. This confirmed at 1.5 cm secundum ASD with left to right flow and RV dilation. He had percutaneous ASD closure in 1/14 due to enlarged  RV. This was successful. Cath prior to closure showed Qp/Qs 1.9/1. Recent f/u echo demonstrates that the ASD closure device appears to be in appropriate position with no visible residual flow.   Last seen by Mark Newcomer, PA - seemed to be doing ok. Has had his MRI earlier this month for follow up of his PA aneurysm - this was reviewed by Dr. Shirlee Latch and was felt to be stable.   Comes in today. Here with his mother. She gives most of the history. He is doing ok. No chest pain. Not short of breath. No syncope. No swelling. Just returned from a cruise and actually lost 2 pounds. No real problems noted. PCP is thru [facility].   Current Outpatient Prescriptions  Medication Sig Dispense Refill  . aspirin EC 81 MG tablet Take 1 tablet (81 mg total) by mouth daily.  90 tablet  3  . cetirizine (ZYRTEC) 10 MG tablet TAKE 1 TABLET (10 MG TOTAL) BY MOUTH DAILY.  30 tablet  5  . fluticasone (FLONASE) 50 MCG/ACT nasal spray Place 2 sprays into the nose daily.      [name] GNP HYDROCORTISONE MAX ST 1 % ointment APPLY TO AFFECTED AREA TWICE A DAY (FOR FACE AND ARMS)  454 g  2  . ketoconazole (NIZORAL) 2 % cream APPLY TOPICALLY TWO   (TWO) TIMES DAILY. TO THE FACE AND ARMS  60 g  2   No current facility-administered medications for this visit.    Allergies  Allergen Reactions  .  Sulfa Antibiotics Other (See Comments)    Violent gastric bleeding   PMH:  1. Chromosome 8p deletion syndrome: Autism + cardiac anomalies. 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 He is followed by a geneticist at [facility].  2. Congenital heart disease: Echo in 2005 showed EF 55-60% with ? 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 Echo (10/13) was a difficult study with EF 60-65%,  mild MR, mild PS peak gradient 18 mmHg, dilated main PA. Cardiac MRI (10/13) with LV EF 55%, prominent trabeculations LV apex suggestive of noncompaction, minimal PI but suspect mild PS, moderate RV dilation with EF 45% (RV EDVi 155 ml/m2), main PA dilated to 4.6 x 4.4 cm, secundum ASD, hard to tell size on MRI, Qp/Qs by MRI was 1.4/1. TEE (12/13): EF 55%, LV noncompaction, RV mild-moderately dilated with normal systolic function, 1.5 cm secundum ASD with left to right flow, no significant PI, unable to measure PV gradient but does not look like significant PS.  Summary:  - Mild PS  - Secundum ASD with dilated RV. ASD was closed percutaneously on 1/14. Qp/Qs on cath prior to procedure was 1.9/1.  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 Autism with moderate mental retardation.  4. Allergic rhinitis  Past Medical History  Diagnosis Date  . UTI (urinary tract infection), uncomplicated 12/2010    Tx c Bactrim  . Pulmonary artery stenosis, main   . Autism disorder   . Mental retardation   . ASD (atrial septal defect)     a. 11/2012 ASD closure of ostium secundum ASD w/ 16mm Amplatzer septal occluder device;  b. 11/2012 Echo: EF 55-60%, mild MR, Triv PS, ASD closure device in place.    Past Surgical History  Procedure Laterality Date  . Tonsillectomy    . Tee without cardioversion  10/23/2012    Procedure: TRANSESOPHAGEAL ECHOCARDIOGRAM (TEE);  Surgeon: Laurey Morale, MD;  Location: [facility] ENDOSCOPY;  Service: Cardiovascular;  Laterality: N/A;  Dr. requesting [name]. come over to push propofol/ pt is hard to sedate  . Cardiac surgery      History  Smoking status  . Never Smoker   Smokeless tobacco  . Never Used    History  Alcohol Use No    No family history on file.  Review of Systems: The review of systems is per the HPI.  All other systems were reviewed and are negative.  Physical Exam: Ht 6\' 2"  (1.88 m)  Wt 225 lb (102.059 kg)  BMI 28.88  kg/m2 Patient is very pleasant and in no acute distress. Answers fairly appropriately. He is of large stature. Weight is down 2 pounds. Skin is warm and dry. Color is normal.  HEENT is unremarkable. Normocephalic/atraumatic. PERRL. Sclera are nonicteric. Neck is supple. No masses. No JVD. Lungs are clear. Cardiac exam shows a regular rate and rhythm. Split S2. Soft systolic murmur noted.  Abdomen is soft. Extremities are without edema. Gait and ROM are intact. No gross neurologic deficits noted.  LABORATORY DATA:  Lab Results  Component Value Date   WBC 7.3 11/13/2012   HGB 16.4 11/13/2012   HCT 49.6 11/13/2012   PLT 197.0 11/13/2012  GLUCOSE 82 11/13/2012   ALT 16 12/07/2010   AST 20 12/07/2010   NA 138 11/13/2012   K 4.1 11/13/2012   CL 100 11/13/2012   CREATININE 1.0 11/13/2012   BUN 17 11/13/2012   CO2 33* 11/13/2012   INR 1.3* 11/13/2012   Mr Angiogram Chest W Wo Contrast  08/10/2013   *RADIOLOGY REPORT*  Clinical Data: PA aneurysm  MRA CHEST WITH OR WITHOUT CONTRAST  Technique: Angiographic images of the chest were obtained using MRA technique without and with intravenous contrast.  Limited FIESTA sequences of the heart were obtained.  Contrast: 20mL MULTIHANCE GADOBENATE DIMEGLUMINE 529 MG/ML IV SOLN  Comparison: None.  Findings: Lung fields showed no gross abnormalities.  There was an ASD closure device noted in the interatrial septum.  Visually, there was mild pulmonic insufficiency.  The main pulmonary artery was stably dilated at 4.6 x 4.4 cm.  The left PA measured 2.2 cm and the right PA 1.9 cm.  The pulmonary veins drained normally to the left atrium.  The thoracic aorta was normal in caliber with a bovine arch.  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  Will repeat study in 1 year.   Original  Report Authenticated By: Marca Ancona    Echo Study Conclusions from February 2014 - Left ventricle: The cavity size was normal. Wall thickness was normal. Systolic function was normal. The estimated ejection fraction was in the range of 50% to 55%. Wall motion was normal; there were no regional wall motion abnormalities. Features are consistent with a pseudonormal left ventricular filling pattern, with concomitant abnormal relaxation and increased filling pressure (grade 2 diastolic dysfunction). - Mitral valve: Mild regurgitation. - Pulmonic valve: The findings are consistent with mild stenosis. Peak gradient: 17mm Hg (S).  Assessment / Plan:  1. Secundum ASD: S/p closure in 11/2012. Follow up echo demonstrated that device was in proper position.  He will remain on ASA. He seems to be doing well clinically without issue.  2. LV Noncompaction: EF normal by most recent echo in February of 2014. 3. Pulmonary Artery Aneurysm: 4.6 cm on MRA 08/2012. Repeat study earlier this month was stable. For repeat study in 1 year   See Dr. Shirlee Latch back in 6 months.   Patient is agreeable to this plan and will call if any problems develop in the interim.   Rosalio Macadamia, RN, ANP-C [facility] [address] [place], [place]  [address]

## 2013-10-06 ENCOUNTER — Other Ambulatory Visit: Payer: Self-pay | Admitting: Cardiovascular Disease

## 2013-11-04 ENCOUNTER — Other Ambulatory Visit: Payer: Self-pay | Admitting: Family Medicine

## 2013-11-30 ENCOUNTER — Other Ambulatory Visit: Payer: Self-pay | Admitting: Family Medicine

## 2013-12-09 ENCOUNTER — Other Ambulatory Visit: Payer: Self-pay | Admitting: Family Medicine

## 2014-02-09 ENCOUNTER — Other Ambulatory Visit: Payer: Self-pay | Admitting: Family Medicine

## 2014-03-15 ENCOUNTER — Other Ambulatory Visit: Payer: Self-pay | Admitting: Family Medicine

## 2014-03-26 ENCOUNTER — Encounter: Payer: Self-pay | Admitting: Cardiology

## 2014-03-26 ENCOUNTER — Ambulatory Visit (INDEPENDENT_AMBULATORY_CARE_PROVIDER_SITE_OTHER): Payer: Medicaid Other | Admitting: Cardiology

## 2014-03-26 ENCOUNTER — Encounter: Payer: Self-pay | Admitting: *Deleted

## 2014-03-26 VITALS — BP 110/80 | HR 70 | Ht 73.0 in | Wt 215.1 lb

## 2014-03-26 DIAGNOSIS — I370 Nonrheumatic pulmonary valve stenosis: Secondary | ICD-10-CM

## 2014-03-26 DIAGNOSIS — I379 Nonrheumatic pulmonary valve disorder, unspecified: Secondary | ICD-10-CM

## 2014-03-26 DIAGNOSIS — Q211 Atrial septal defect, unspecified: Secondary | ICD-10-CM

## 2014-03-26 DIAGNOSIS — Q2111 Secundum atrial septal defect: Secondary | ICD-10-CM

## 2014-03-26 DIAGNOSIS — Q221 Congenital pulmonary valve stenosis: Secondary | ICD-10-CM

## 2014-03-26 DIAGNOSIS — I281 Aneurysm of pulmonary artery: Secondary | ICD-10-CM

## 2014-03-26 MED ORDER — ASPIRIN EC 81 MG PO TBEC: 81.0000 mg | DELAYED_RELEASE_TABLET | Freq: Every day | ORAL | Status: DC

## 2014-03-26 NOTE — Patient Instructions (Signed)
Your physician has requested that you have an echocardiogram. Echocardiography is a painless test that uses sound waves to create images of your heart. It provides your doctor with information about the size and shape of your heart and how well your heart's chambers and valves are working. This procedure takes approximately one hour. There are no restrictions for this procedure. October 2015  Your physician has recommended that you have an MRA of your chest in October 2015.  Your physician wants you to follow-up in: November 2015. You will receive a reminder letter in the mail two months in advance. If you don't receive a letter, please call our office to schedule the follow-up appointment.

## 2014-03-27 NOTE — Progress Notes (Signed)
Patient ID: [name], male   DOB: Aug 04, 1986, 28 y.o.   MRN: [id number] PCP: Dr. 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  Patient was followed in the past by a pediatric cardiologist then by Dr. Reyes Ivan at [facility].  He had not had cardiology followup for several years prior to his initial appointment with me.  Echo in 2005 showed normal LV EF but questioned noncompaction towards the apex, mild pulmonic stenosis, small secundum ASD, and normal tricuspid valve.  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  Patient is relatively high-functioning for autism.  He reads and writes and has a relatively good memory per his mother.  He has trouble with social interactions.  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  No exertional dyspnea or chest pain.  He participates in the [employer] and skates, bowls, swims, and plays basketball.  No history of syncope or lightheadedness.  His heart rate races at times but only with exertion (no palpitations noted at rest or with mild exertion).     In 10/13,  I had him do an echo (difficult study).  LV EF was normal, there appeared to be mild PS with mean gradient 18 mmHg, the main PA was moderately dilated.  The atrial septum and RV were not well-visualized.  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  Given the ASD and RV dilation, TEE was done.  This confirmed at 1.5 cm secundum ASD with left to right flow and RV dilation.  He had percutaneous ASD closure in 1/14 due to enlarge RV.  This was successful.  Cath prior to closure showed Qp/Qs 1.9/1.  Echo after procedure in 2/14 showed EF 50-55%, mild MR, intact  ASD closure device, mild PS with peak gradient 17 mmHg.  MRA chest in 10/14 showed stable 4.6 cm main PA.    ECG: NSR, inferior T wave inversions  PMH: 1. Chromosome 8p deletion syndrome: Autism + cardiac anomalies.  He has pulmonic stenosis and a secundum ASD.  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  He is followed by a geneticist at [facility].  2. Congenital heart disease: Echo in 2005 showed EF 55-60% with ? 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  Echo (10/13) was a difficult study with EF 60-65%, mild MR, mild PS peak gradient 18 mmHg, dilated main PA.  Cardiac MRI (10/13) with LV EF 55%, prominent trabeculations LV apex suggestive of noncompaction, minimal PI but suspect mild PS, moderate RV dilation with EF 45% (RV EDVi 155 ml/m2), main PA dilated to 4.6 x 4.4 cm, secundum ASD, hard to tell size on MRI, Qp/Qs by MRI was 1.4/1.  TEE (12/13): EF 55%, LV noncompaction, RV mild-moderately dilated with normal systolic function, 1.5 cm secundum ASD with left to right flow, no significant PI, unable to measure PV gradient but does not look like significant PS.  Echo after ASD closure  in (2/14) with  EF 50-55%, mild MR, intact ASD closure device, mild PS with peak gradient 17 mmHg. Summary:  - Mild PS - Secundum ASD with dilated RV.  ASD was closed percutaneously on 1/14.  Qp/Qs on cath prior to procedure was 1.9/1.   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  MRA chest in 10/14 showed stable 4.6 cm main PA dilation.  3. Autism with moderate mental retardation.  4. Allergic rhinitis  SH: Living in group home and participating in a day program.  Mother very involved with his care.  Nonsmoker, no drugs.  FH: Mother with heart murmur.   ROS: All systems reviewed  and negative except as per HPI.   Current Outpatient Prescriptions  Medication Sig Dispense Refill  . aspirin EC 81 MG tablet Take 1 tablet (81 mg total) by mouth daily.  90 tablet  3  . cetirizine (ZYRTEC) 10 MG tablet TAKE 1 TABLET (10 MG TOTAL) BY MOUTH DAILY.  30 tablet  2  . fluticasone (FLONASE) 50 MCG/ACT nasal spray INSTILL TWO   SPRAYS IN EACH NOSTRIL DAILY  16 g  5  . hydrocortisone 1 % ointment APPLY TO AFFECTED AREA TWICE A DAY (FOR FACE AND ARMS)  454 g  5  . ketoconazole (NIZORAL) 2 % cream APPLY TOPICALLY TWO TIMES DAILY. TO THE FACE AND ARMS  60 g  1   No current facility-administered medications for this visit.    BP 110/80  Pulse 70  Ht 6\' 1"  (1.854 m)  Wt 215 lb 1.9 oz (97.578 kg)  BMI 28.39 kg/m2 General: NAD Neck: No JVD, no thyromegaly or thyroid nodule.  Lungs: Clear to auscultation bilaterally with normal respiratory effort. CV: Nondisplaced PMI.  Heart regular S1/S2, no S3/S4, fixed split S2, 1/6 early SEM LUSB.  No peripheral edema.  No carotid bruit.  Normal pedal pulses.  Abdomen: Soft, nontender, no hepatosplenomegaly, no distention.  Skin: Intact without lesions or rashes.  Neurologic: Alert and oriented x 3.  Psych: Normal affect. Extremities: No clubbing or cyanosis.   Assessment/Plan: 1. Secundum ASD: Given enlarged RV, this was closed percutaneously in 1/14.  Last echo in 2/14 showed ASD closure device in place with no visible residual shunt.  Continue ASA 81 long-term.   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  LV EF is preserved.   3. 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  This can be seen with pulmonic stenosis (appears to be mild in this patient).  High flow from prior ASD may be a major contributor as well.  It is possible that with the ASD closed, the PA will not enlarge further or potentially even decrease in size given decrease in pulmonary flow.  Will repeat MRA chest in 10/15 to follow the PA.   4.  Pulmonic stenosis: Mild by echo.  No significant pulmonic insufficiency was visualized.  Repeat echo in 10/15 to follow.  Laurey MoraleDalton S Gwendloyn Forsee 03/27/2014

## 2014-04-26 ENCOUNTER — Other Ambulatory Visit: Payer: Self-pay | Admitting: Family Medicine

## 2014-04-26 ENCOUNTER — Encounter: Payer: Self-pay | Admitting: Family Medicine

## 2014-04-26 ENCOUNTER — Ambulatory Visit (INDEPENDENT_AMBULATORY_CARE_PROVIDER_SITE_OTHER): Payer: Medicaid Other | Admitting: Family Medicine

## 2014-04-26 VITALS — BP 123/68 | HR 76 | Temp 98.8°F | Ht 71.5 in | Wt 214.0 lb

## 2014-04-26 DIAGNOSIS — Q211 Atrial septal defect, unspecified: Secondary | ICD-10-CM

## 2014-04-26 DIAGNOSIS — Q2111 Secundum atrial septal defect: Secondary | ICD-10-CM

## 2014-04-26 DIAGNOSIS — F419 Anxiety disorder, unspecified: Secondary | ICD-10-CM | POA: Insufficient documentation

## 2014-04-26 DIAGNOSIS — F418 Other specified anxiety disorders: Secondary | ICD-10-CM | POA: Insufficient documentation

## 2014-04-26 DIAGNOSIS — F411 Generalized anxiety disorder: Secondary | ICD-10-CM

## 2014-04-26 MED ORDER — LORAZEPAM 0.5 MG PO TABS
0.2500 mg | ORAL_TABLET | ORAL | Status: DC | PRN
Start: 2014-04-26 — End: 2015-09-09

## 2014-04-26 NOTE — Assessment & Plan Note (Signed)
Followed by Dr. Shirlee Latch, continues to follow.  Appreciate recommendations.

## 2014-04-26 NOTE — Patient Instructions (Signed)
Please use the ativan 0.25-0.5 mg 1 hr prior to flying.  Thanks, Dr. Paulina Fusi

## 2014-04-26 NOTE — Assessment & Plan Note (Signed)
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 Recommend ativan 1 hr prior to flight and as needed.

## 2014-04-26 NOTE — Progress Notes (Signed)
[name] is a 28 y.o. male who presents today for physical.  Pt doing well, no complaints per mom.  Forms filled out for camp.    Mom would like something for him for flights as he gets anxious.   Past Medical History  Diagnosis Date  . UTI (urinary tract infection), uncomplicated 12/2010    Tx c Bactrim  . Pulmonary artery stenosis, main   . Autism disorder   . Mental retardation   . ASD (atrial septal defect)     a. 11/2012 ASD closure of ostium secundum ASD w/ 27mm Amplatzer septal occluder device;  b. 11/2012 Echo: EF 55-60%, mild MR, Triv PS, ASD closure device in place.    History  Smoking status  . Never Smoker   Smokeless tobacco  . Never Used    No family history on file.  Current Outpatient Prescriptions on File Prior to Visit  Medication Sig Dispense Refill  . aspirin EC 81 MG tablet Take 1 tablet (81 mg total) by mouth daily.  90 tablet  3  . cetirizine (ZYRTEC) 10 MG tablet TAKE 1 TABLET (10 MG TOTAL) BY MOUTH DAILY.  30 tablet  2  . fluticasone (FLONASE) 50 MCG/ACT nasal spray INSTILL TWO   SPRAYS IN EACH NOSTRIL DAILY  16 g  5  . hydrocortisone 1 % ointment APPLY TO AFFECTED AREA TWICE A DAY (FOR FACE AND ARMS)  454 g  5  . ketoconazole (NIZORAL) 2 % cream APPLY TOPICALLY TWO TIMES DAILY. TO THE FACE AND ARMS  60 g  1   No current facility-administered medications on file prior to visit.    ROS: Per HPI.  All other systems reviewed and are negative.   Physical Exam Filed Vitals:   04/26/14 0939  BP: 123/68  Pulse: 76  Temp: 98.8 F (37.1 C)    Physical Examination: General appearance - alert, well appearing, and in no distress Neck - supple, no significant adenopathy Chest - clear to auscultation, no wheezes, rales or rhonchi, symmetric air entry Heart - normal rate and regular rhythm, systolic murmur 1/6 at lower left sternal border Abdomen - soft, nontender, nondistended, no masses or organomegaly

## 2014-05-17 ENCOUNTER — Ambulatory Visit (INDEPENDENT_AMBULATORY_CARE_PROVIDER_SITE_OTHER): Payer: Medicaid Other | Admitting: Family Medicine

## 2014-05-17 ENCOUNTER — Encounter: Payer: Self-pay | Admitting: Family Medicine

## 2014-05-17 VITALS — BP 121/68 | HR 81 | Temp 97.9°F | Ht 71.5 in | Wt 223.0 lb

## 2014-05-17 DIAGNOSIS — J309 Allergic rhinitis, unspecified: Secondary | ICD-10-CM

## 2014-05-17 MED ORDER — FLUTICASONE PROPIONATE 50 MCG/ACT NA SUSP: 2.0000 | Freq: Every day | NASAL | Status: DC

## 2014-05-17 MED ORDER — MONTELUKAST SODIUM 10 MG PO TABS: 10.0000 mg | ORAL_TABLET | Freq: Every day | ORAL | Status: DC

## 2014-05-17 NOTE — Progress Notes (Signed)
   Subjective:    Patient ID: [name], male    DOB: 1986/01/02, 28 y.o.   MRN: [id number]  HPI Patient presents for same day appointment of cough and congestion. Symptoms started 5 days ago and have remained unchanged. He has used his Zyrtec and Flonase as prescribed with no significant improvement. He usually has these episodes with changing seasons and attributes current symptoms to change in weather. Symptoms are persistent throughout the day.   Past Medical History  Diagnosis Date  . UTI (urinary tract infection), uncomplicated 12/2010    Tx c Bactrim  . Pulmonary artery stenosis, main   . Autism disorder   . Mental retardation   . ASD (atrial septal defect)     a. 11/2012 ASD closure of ostium secundum ASD w/ 4mm Amplatzer septal occluder device;  b. 11/2012 Echo: EF 55-60%, mild MR, Triv PS, ASD closure device in place.    Review of Systems  Constitutional: Negative for fever and chills.  HENT: Positive for congestion, rhinorrhea and sneezing. Negative for postnasal drip, sinus pressure and sore throat.   Respiratory: Positive for cough. Negative for shortness of breath and wheezing.   Cardiovascular: Negative for chest pain.       Objective:   Physical Exam  Constitutional: He is oriented to person, place, and time. He appears well-developed and well-nourished.  HENT:  Nose: Mucosal edema present.  Mouth/Throat: Oropharynx is clear and moist and mucous membranes are normal.  Eyes: Conjunctivae and EOM are normal.  Neck: Normal range of motion. Neck supple.  Cardiovascular: Normal rate and regular rhythm.   Murmur heard. Pulmonary/Chest: Effort normal and breath sounds normal. No respiratory distress. He has no wheezes. He has no rales.  Lymphadenopathy:    He has no cervical adenopathy.  Neurological: He is alert and oriented to person, place, and time.  Skin: Skin is warm and dry.        Assessment & Plan:

## 2014-05-17 NOTE — Patient Instructions (Signed)
[name], it was a pleasure seeing you today. Today we talked about your allergy symptoms. I am increasing your Flonase. Please take 2 sprays into each nostril daily. I am also prescribing Montelukast (Singulair). Please take this daily.  Please schedule an appointment to see Dr. Paulina Fusi at your agreed upon time.  If you have any questions or concerns, please do not hesitate to call the office at [phone].  Sincerely,  Jacquelin Hawking, MD

## 2014-05-17 NOTE — Assessment & Plan Note (Signed)
Increased Flonase to 2 sprays daily. Also prescribed Singulair. Recommended saline nasal spray for nasal congestion.

## 2014-07-30 ENCOUNTER — Other Ambulatory Visit: Payer: Self-pay | Admitting: Family Medicine

## 2014-08-03 ENCOUNTER — Other Ambulatory Visit: Payer: Self-pay | Admitting: Cardiology

## 2014-08-05 ENCOUNTER — Ambulatory Visit (HOSPITAL_BASED_OUTPATIENT_CLINIC_OR_DEPARTMENT_OTHER): Payer: Medicaid Other | Admitting: Radiology

## 2014-08-05 ENCOUNTER — Ambulatory Visit (HOSPITAL_COMMUNITY)
Admission: RE | Admit: 2014-08-05 | Discharge: 2014-08-05 | Disposition: A | Discharge status: Home / Self Care | Payer: Medicaid Other | Source: Ambulatory Visit | Attending: Cardiology | Admitting: Cardiology

## 2014-08-05 DIAGNOSIS — Q211 Atrial septal defect, unspecified: Secondary | ICD-10-CM

## 2014-08-05 DIAGNOSIS — I428 Other cardiomyopathies: Secondary | ICD-10-CM

## 2014-08-05 DIAGNOSIS — I370 Nonrheumatic pulmonary valve stenosis: Secondary | ICD-10-CM

## 2014-08-05 DIAGNOSIS — I281 Aneurysm of pulmonary artery: Secondary | ICD-10-CM

## 2014-08-05 DIAGNOSIS — Q221 Congenital pulmonary valve stenosis: Secondary | ICD-10-CM | POA: Diagnosis present

## 2014-08-05 MED ORDER — GADOBENATE DIMEGLUMINE 529 MG/ML IV SOLN
20.0000 mL | Freq: Once | INTRAVENOUS | Status: AC
  Administered 2014-08-05: 20 mL via INTRAVENOUS

## 2014-08-05 NOTE — Progress Notes (Signed)
Echocardiogram performed.  

## 2014-08-09 ENCOUNTER — Ambulatory Visit (INDEPENDENT_AMBULATORY_CARE_PROVIDER_SITE_OTHER): Payer: Medicaid Other | Admitting: Physician Assistant

## 2014-08-09 ENCOUNTER — Encounter: Payer: Self-pay | Admitting: Physician Assistant

## 2014-08-09 VITALS — BP 110/78 | HR 73 | Ht 71.5 in | Wt 228.0 lb

## 2014-08-09 DIAGNOSIS — I428 Other cardiomyopathies: Secondary | ICD-10-CM

## 2014-08-09 DIAGNOSIS — I370 Nonrheumatic pulmonary valve stenosis: Secondary | ICD-10-CM

## 2014-08-09 DIAGNOSIS — Q211 Atrial septal defect, unspecified: Secondary | ICD-10-CM

## 2014-08-09 DIAGNOSIS — I281 Aneurysm of pulmonary artery: Secondary | ICD-10-CM

## 2014-08-09 NOTE — Patient Instructions (Signed)
Your physician recommends that you continue on your current medications as directed. Please refer to the Current Medication list given to you today.   Your physician has requested that you have an echocardiogram THIS IS TO BE DONE IN 1 YEAR. Echocardiography is a painless test that uses sound waves to create images of your heart. It provides your doctor with information about the size and shape of your heart and how well your heart's chambers and valves are working. This procedure takes approximately one hour. There are no restrictions for this procedure.  YOU WILL NEED TO SCHEDULE AN MRA OF THE CHEST; THIS WILL BE DONE AT [facility]; THIS IS TO BE DONE IN 1 YEAR  Your physician wants you to follow-up in: 12 MONTHS WITH DR. Shirlee Latch . You will receive a reminder letter in the mail two months in advance. If you don't receive a letter, please call our office to schedule the follow-up appointment.

## 2014-08-09 NOTE — Progress Notes (Signed)
Cardiology Office Note    Date:  08/09/2014   ID:  [name], DOB [birth year], MRN [id number]  PCP:  Gildardo Cranker, DO  Cardiologist:  Dr. Marca Ancona    History of Present Illness: [name] is a 28 y.o. 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 Patient was followed in the past by a pediatric cardiologist then by Dr. Reyes Ivan at [facility]. He had not had cardiology followup for several years prior to his initial appointment with Dr. Shirlee Latch. Echo in 2005 showed normal LV EF but questioned noncompaction towards the apex, mild pulmonic stenosis, small secundum ASD, and normal tricuspid valve. 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   Patient is relatively high-functioning for autism. He reads and writes and has a relatively good memory per his mother. He has trouble with social interactions. 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 No exertional dyspnea or chest pain. He participates in the [employer] and skates, bowls, swims, and plays basketball.   In 10/13, echo demonstrated LVEF was normal and there appeared to be mild PS with mean gradient 18 mmHg, the main PA was moderately dilated. The atrial septum and RV were not well-visualized. 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 Given the ASD and RV dilation, TEE was done. This confirmed at 1.5 cm secundum ASD with left to right flow and RV dilation. He had percutaneous ASD closure in 1/14 due to enlarged RV. This was successful. Cath prior to closure showed Qp/Qs 1.9/1. Echo after procedure in 2/14 showed EF 50-55%, mild MR, intact ASD closure device, mild PS with peak  gradient 17 mmHg. MRA chest in 10/14 showed stable 4.6 cm main PA.   Last seen by Dr. Marca Ancona 5/15.  He had his FU echo and MRA recently and he returns for FU.  He is here with his mother.  He is doing well.  Still goes to a day program.  He likes to go bowling and is currently participating in roller skating in the [employer].  He denies chest pain, dyspnea, syncope, edema.     Studies:  - Echo (10/15):  EF 50-55%, normal wall motion, severe LVH and prominent trabeculations in the apex suspicious for non-compaction CM, ASD occluder okay (no residual shunt), mild TR, mild pulmonic stenosis (mean gradient 5 mm Hg),   Recent Labs/Images:  No results found for this basename: NA, K, BUN, CREATININE, ALT, HGB, TSH, LDL, LDLCALC, LDLDIRECT, HDL, BNP, PROBNP,  in the last 8760 hours    Mr Angiogram Chest W Wo Contrast  08/05/2014      IMPRESSION: 1. Stable 4.6 cm dilatation of main pulmonary artery. 2. No thoracic aortic pathology or other acute abnormality.   Electronically Signed   By: [name] M.D.   On: 08/05/2014 16:30     Wt Readings from Last 3 Encounters:  08/09/14 228 lb (103.42 kg)  05/17/14 223 lb (101.152 kg)  04/26/14 214 lb (97.07 kg)     PMH:  1. Chromosome 8p deletion syndrome: Autism + cardiac anomalies. He has pulmonic stenosis and a secundum ASD. There is note in the chart of  Ebstein's anomaly but this was not seen on last echo or cMRI. He is followed by a geneticist at [facility].  2. Congenital heart disease: Echo in 2005 showed EF 55-60% with ? 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 Echo (10/13) was a difficult study with EF 60-65%, mild MR, mild PS peak gradient 18 mmHg, dilated main PA. Cardiac MRI (10/13) with LV EF 55%, prominent trabeculations LV apex suggestive of noncompaction, minimal  PI but suspect mild PS, moderate RV dilation with EF 45% (RV EDVi 155 ml/m2), main PA dilated to 4.6 x 4.4 cm, secundum ASD, hard to tell size on MRI, Qp/Qs by MRI was 1.4/1. TEE (12/13): EF 55%, LV noncompaction, RV mild-moderately dilated with normal systolic function, 1.5 cm secundum ASD with left to right flow, no significant PI, unable to measure PV gradient but does not look like significant PS. Echo after ASD closure in (2/14) with EF 50-55%, mild MR, intact ASD closure device, mild PS with peak gradient 17 mmHg.  Summary:  - Mild PS  - Secundum ASD with dilated RV. ASD was closed percutaneously on 1/14. Qp/Qs on cath prior to procedure was 1.9/1.  - LV noncompaction with normal EF  - Pulmonary artery aneurysm. MRA chest in 10/14 showed stable 4.6 cm main PA dilation.  3. Autism with moderate mental retardation.  4. Allergic rhinitis Past Medical History  Diagnosis Date  . UTI (urinary tract infection), uncomplicated 12/2010    Tx c Bactrim  . Pulmonary artery stenosis, main   . Autism disorder   . Mental retardation   . ASD (atrial septal defect)     a. 11/2012 ASD closure of ostium secundum ASD w/ 26mm Amplatzer septal occluder device;  b. 11/2012 Echo: EF 55-60%, mild MR, Triv PS, ASD closure device in place.    Current Outpatient Prescriptions  Medication Sig Dispense Refill  . aspirin EC 81 MG tablet Take ONE tablet by mouth EACH MORNING  30 tablet  1  . cetirizine (ZYRTEC) 10 MG tablet TAKE 1 TABLET (10 MG TOTAL) BY MOUTH DAILY.  90 tablet  3  . fluticasone (FLONASE) 50 MCG/ACT nasal spray Place 2 sprays into both nostrils daily.  16 g  1  . hydrocortisone 1 % ointment APPLY TO AFFECTED AREA TWICE A DAY (FOR FACE AND ARMS)  454 g  5  . ketoconazole (NIZORAL) 2 % cream APPLY TOPICALLY TWO TIMES DAILY. TO THE FACE AND ARMS  60 g  1  . LORazepam (ATIVAN) 0.5 MG tablet Take 0.5-1 tablets (0.25-0.5 mg total) by mouth as needed for anxiety (for flying).  10 tablet  0  . montelukast  (SINGULAIR) 10 MG tablet TAKE 1 TABLET BY MOUTH DAILY AT BEDTIME  30 tablet  0   No current facility-administered medications for this visit.     Allergies:   Sulfa antibiotics   Social History:  The patient  reports that he has never smoked. He has never used smokeless tobacco. He reports that he does not drink alcohol or use illicit drugs.   Family History:  The patient's family history includes Diabetes in his mother; Hypertension in his mother. There is no history of Heart attack or Stroke.   ROS:  Please see the history of present illness.      All other systems reviewed and negative.  PHYSICAL EXAM: VS:  BP 110/78  Pulse 73  Ht 5' 11.5" (1.816 m)  Wt 228 lb (103.42 kg)  BMI 31.36 kg/m2 Well nourished, well developed, in no acute distress HEENT: normal Neck: no JVD Cardiac:  normal S1, S2; RRR; no murmur Lungs:  clear to auscultation bilaterally, no wheezing, rhonchi or rales Abd: soft, nontender, no hepatomegaly Ext: no edema Skin: warm and dry Neuro:  CNs 2-12 intact, no focal abnormalities noted  EKG:  NSR, HR 73, normal axis, TWI in 3, aVF, no change from prior tracing      ASSESSMENT AND PLAN:  1.  ASD (atrial septal defect):  Recent echo with well seated ASD occluder.  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  Repeat in 1 year.  D/w Dr. Marca Anconaalton McLean.   3.  Pulmonic stenosis:  Mild by recent echo.  Repeat Echo 1 year.  4.  Aneurysm of pulmonary artery:  Stable dilation on recent MRA.  Repeat in 1 year.   Disposition:   FU with Dr. Marca Anconaalton McLean in 1 year after his tests   Signed, Brynda RimScott Lyn Joens, PA-C, MHS 08/09/2014 2:15 PM    [facility] [address], [place], [place]  [address] Phone: [phone]; Fax: [phone]

## 2014-08-16 ENCOUNTER — Other Ambulatory Visit: Payer: Self-pay | Admitting: Family Medicine

## 2014-08-26 ENCOUNTER — Ambulatory Visit: Payer: Medicaid Other

## 2014-08-30 ENCOUNTER — Ambulatory Visit (INDEPENDENT_AMBULATORY_CARE_PROVIDER_SITE_OTHER): Payer: Medicaid Other | Admitting: *Deleted

## 2014-08-30 DIAGNOSIS — Z23 Encounter for immunization: Secondary | ICD-10-CM

## 2014-09-03 ENCOUNTER — Other Ambulatory Visit: Payer: Self-pay | Admitting: Family Medicine

## 2014-09-15 ENCOUNTER — Other Ambulatory Visit: Payer: Self-pay | Admitting: Cardiology

## 2014-10-14 ENCOUNTER — Encounter (HOSPITAL_COMMUNITY): Payer: Self-pay | Admitting: Cardiovascular Disease

## 2015-03-28 ENCOUNTER — Encounter: Payer: Self-pay | Admitting: Family Medicine

## 2015-03-28 NOTE — Progress Notes (Signed)
[employer] form dropped off to be completed.  Please mail in attached envelope.  Placed in [place] box because for 2 different patients.

## 2015-03-28 NOTE — Progress Notes (Signed)
Placed envelope and form in your box, [place].

## 2015-03-29 ENCOUNTER — Telehealth: Payer: Self-pay | Admitting: *Deleted

## 2015-03-29 NOTE — Progress Notes (Signed)
Will fill out later today and give back.   Thanks [employer]. Paulina Fusi, DO of [facility] 03/29/2015, 9:32 AM

## 2015-03-29 NOTE — Telephone Encounter (Signed)
Contacted pt mother and informed her that the [employer] forms would be put in the mail this afternoon. Lamonte Sakai, Mike Berntsen D, [place]

## 2015-03-31 NOTE — Progress Notes (Signed)
Forms completed and pt notified.  Placed in mail on 03/29/2015. [name], Mckinsley Koelzer D, [place]

## 2015-04-12 ENCOUNTER — Ambulatory Visit (INDEPENDENT_AMBULATORY_CARE_PROVIDER_SITE_OTHER): Payer: Medicaid Other | Admitting: Family Medicine

## 2015-04-12 ENCOUNTER — Encounter: Payer: Self-pay | Admitting: Family Medicine

## 2015-04-12 VITALS — BP 122/75 | HR 68 | Temp 97.8°F | Ht 72.0 in | Wt 222.0 lb

## 2015-04-12 DIAGNOSIS — Z0000 Encounter for general adult medical examination without abnormal findings: Secondary | ICD-10-CM | POA: Diagnosis not present

## 2015-04-12 NOTE — Progress Notes (Signed)
[name] is a 29 y.o. male who presents today for physical.  Pt doing well, no complaints per mom. UTD on current immunizations.    ASD fixed 11/25/12 by Dr. Excell Seltzer, follow up by Dr. Shirlee Latch.  He has had repeat Echo's and MRA which have shown stable pulmonic stenosis and ASD along with LVEF.  Doing well currently, due for repeat echo in October 2016.    Past Medical History  Diagnosis Date  . Pulmonary artery stenosis, main   . Autism disorder   . Mental retardation   . ASD (atrial septal defect)     a. 11/2012 ASD closure of ostium secundum ASD w/ 54mm Amplatzer septal occluder device;  b. 11/2012 Echo: EF 55-60%, mild MR, Triv PS, ASD closure device in place.    History  Smoking status  . Never Smoker   Smokeless tobacco  . Never Used    Family History  Problem Relation Age of Onset  . Heart attack Neg Hx   . Stroke Neg Hx   . Hypertension Mother   . Diabetes Mother     Current Outpatient Prescriptions on File Prior to Visit  Medication Sig Dispense Refill  . aspirin EC 81 MG tablet Take ONE tablet by mouth each morning 30 tablet 10  . fluticasone (FLONASE) 50 MCG/ACT nasal spray Place 2 sprays into both nostrils daily. 16 g 1  . LORazepam (ATIVAN) 0.5 MG tablet Take 0.5-1 tablets (0.25-0.5 mg total) by mouth as needed for anxiety (for flying). 10 tablet 0  . montelukast (SINGULAIR) 10 MG tablet Take one tablet by mouth AT BEDTIME 30 tablet 11  . QC ALL DAY ALLERGY 10 MG tablet Take ONE tablet by mouth once daily 30 tablet 11   No current facility-administered medications on file prior to visit.    Patient Information Form: Screening and ROS  AUDIT-C Score: 0 Do you feel safe in relationships? Yes.   PHQ-2:negative  Review of Symptoms  General:  Negative for nexplained weight loss, fever Skin: Negative for new or changing mole, sore that won't heal HEENT: Negative for trouble hearing, trouble seeing, ringing in ears, mouth sores, hoarseness, change in voice,  dysphagia. CV:  Negative for chest pain, dyspnea, edema, palpitations Resp: Negative for cough, dyspnea, hemoptysis GI: Negative for nausea, vomiting, diarrhea, constipation, abdominal pain, melena, hematochezia. GU: Negative for dysuria, incontinence, urinary hesitance, hematuria, vaginal or penile discharge, polyuria, sexual difficulty, lumps in testicle or breasts MSK: Negative for muscle cramps or aches, joint pain or swelling Neuro: Negative for headaches, weakness, numbness, dizziness, passing out/fainting Psych: Negative for depression, anxiety, memory problems   Physical Exam Filed Vitals:   04/12/15 0941  BP: 122/75  Pulse: 68  Temp: 97.8 F (36.6 C)    Physical Examination: General appearance - alert, well appearing, and in no distress Neck - supple, no significant adenopathy Chest - clear to auscultation, no wheezes, rales or rhonchi, symmetric air entry Heart - normal rate and regular rhythm, systolic murmur 1/6 at lower left sternal border Abdomen - soft, nontender, nondistended, no masses or organomegaly

## 2015-04-12 NOTE — Assessment & Plan Note (Signed)
Doing well, up to date with current immunizations and recommendations - ASD and valve follow up per cardiology in October 2016, appreciate recommendations.  - F/U in 1 yr or sooner if needed.

## 2015-07-30 ENCOUNTER — Other Ambulatory Visit: Payer: Self-pay | Admitting: Family Medicine

## 2015-08-01 ENCOUNTER — Other Ambulatory Visit: Payer: Self-pay | Admitting: Family Medicine

## 2015-08-01 NOTE — Telephone Encounter (Signed)
This is your patient, see refill request. 

## 2015-08-01 NOTE — Telephone Encounter (Signed)
This is your pt, see refill request 

## 2015-08-16 ENCOUNTER — Ambulatory Visit (INDEPENDENT_AMBULATORY_CARE_PROVIDER_SITE_OTHER): Payer: Medicaid Other | Admitting: *Deleted

## 2015-08-16 DIAGNOSIS — Z23 Encounter for immunization: Secondary | ICD-10-CM | POA: Diagnosis not present

## 2015-08-23 ENCOUNTER — Other Ambulatory Visit: Payer: Self-pay | Admitting: Family Medicine

## 2015-08-23 NOTE — Telephone Encounter (Signed)
This medication was DENIED on 9/27, patient needs office visit BEFORE this medication is filled again per last note to pharmacy.  Please advise patient.

## 2015-09-08 ENCOUNTER — Ambulatory Visit: Payer: Medicaid Other | Admitting: Cardiology

## 2015-09-09 ENCOUNTER — Encounter: Payer: Self-pay | Admitting: Internal Medicine

## 2015-09-09 ENCOUNTER — Ambulatory Visit (INDEPENDENT_AMBULATORY_CARE_PROVIDER_SITE_OTHER): Payer: Medicaid Other | Admitting: Internal Medicine

## 2015-09-09 VITALS — BP 118/82 | HR 85 | Temp 98.2°F | Ht 72.0 in | Wt 212.0 lb

## 2015-09-09 DIAGNOSIS — J302 Other seasonal allergic rhinitis: Secondary | ICD-10-CM

## 2015-09-09 DIAGNOSIS — L219 Seborrheic dermatitis, unspecified: Secondary | ICD-10-CM | POA: Diagnosis not present

## 2015-09-09 DIAGNOSIS — L209 Atopic dermatitis, unspecified: Secondary | ICD-10-CM

## 2015-09-09 MED ORDER — FLUTICASONE PROPIONATE 50 MCG/ACT NA SUSP: 2.0000 | Freq: Every day | NASAL | Status: DC

## 2015-09-09 MED ORDER — KETOCONAZOLE 1 % EX SHAM: 1.0000 | MEDICATED_SHAMPOO | CUTANEOUS | Status: DC

## 2015-09-09 MED ORDER — HYDROCORTISONE 1 % EX CREA: TOPICAL_CREAM | CUTANEOUS | Status: DC

## 2015-09-09 MED ORDER — KETOCONAZOLE 2 % EX CREA: 1.0000 "application " | TOPICAL_CREAM | Freq: Every day | CUTANEOUS | Status: DC

## 2015-09-09 MED ORDER — KETOCONAZOLE 2 % EX SHAM: 1.0000 "application " | MEDICATED_SHAMPOO | CUTANEOUS | Status: DC

## 2015-09-09 NOTE — Assessment & Plan Note (Addendum)
-  Prescribed ketoconazole 2% shampoo for patient to try. -Advised using ketoconazole cream in eyebrows for skin flaking.

## 2015-09-09 NOTE — Patient Instructions (Addendum)
[name], it was nice to meet you today.  For dandruff, I have prescribed an antifungal shampoo to try.  For your eczema, I have refilled your hydrocortisone and ketoconazole cream. However, eurcerin cream or vaseline may help more with the dryness.  I have also refilled your flonase.  Please return as needed.   Thank you! Dr. Sampson Goon

## 2015-09-09 NOTE — Assessment & Plan Note (Signed)
Refilled flonase  

## 2015-09-09 NOTE — Progress Notes (Signed)
Subjective: [name] is a 29 y.o. male patient of Delynn Flavin, DO, accompanied by his mother, presenting for medication refills.  Eczema: -Mother requesting refills of hydrocortisone cream and ketoconazole cream. -States [name] has been using both on his hands, knees and elbows for 6 years with success. -Mother has noticed pt's hands have looked dryer since running out of ketoconazole cream.  Dandruff:  -Has improved since patient had dreadlocks cut off. -Mother still notices that large skin flakes have been falling off frequently. -She has also noticed skin flakes around pt's eyebrows. -He has been using Head & Shoulders and Sulfur-8 shampoos.  Seasonal Allergies: -Well controlled with flonase, zyrtec and singulair.  ASD: -Patient to follow-up with cardiology for MRI and ECHO next week.   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 Endorses pruritis of scalp.  - Nonsmoker  Objective: BP 118/82 mmHg  Pulse 85  Temp(Src) 98.2 F (36.8 C) (Oral)  Ht 6' (1.829 m)  Wt 212 lb (96.163 kg)  BMI 28.75 kg/m2  SpO2 96% Gen: Well-appearing 29 y.o. male in no distress HEENT: no conjuctivitis no swelling of nasal turbinates, no erythema of throat, right TM pearly and non-erythematous, left TM unable to be visualized due to wax burden Cardiac: 2/6 systolic murmur heard over left lower sternal border Pulm: CTAB Skin: dry, scaly patches of skin between webs of fingers; several dry, scaly patches of skin on scalp  Assessment/Plan: [name] is a 29 y.o. male here for medication refills and dandruff.  Return in a year or as needed.   Seborrheic dermatitis -Prescribed ketoconazole 2% shampoo for patient to try. -Advised using ketoconazole cream in eyebrows for skin flaking.  ECZEMA, ATOPIC DERMATITIS -Refilled  hydrocortisone and ketoconazole creams. -Advised pt and mother that pt's dry skin of hands would likely be better treated with hydrocortisone and eucerin cream or vaseline rather than the ketoconazole cream.   Seasonal allergies -Refilled flonase.    Dani Gobble, MD [facility], PGY-1

## 2015-09-09 NOTE — Assessment & Plan Note (Signed)
" >>  ASSESSMENT AND PLAN FOR ECZEMA WRITTEN ON 09/09/2015  6:11 PM BY Ananth Fiallos MOEN, MD  -Refilled hydrocortisone  and ketoconazole  creams. -Advised pt and mother that pt's dry skin of hands would likely be better treated with hydrocortisone  and eucerin cream or vaseline rather than the ketoconazole  cream.  "

## 2015-09-09 NOTE — Assessment & Plan Note (Signed)
-  Refilled hydrocortisone and ketoconazole creams. -Advised pt and mother that pt's dry skin of hands would likely be better treated with hydrocortisone and eucerin cream or vaseline rather than the ketoconazole cream.

## 2015-09-12 ENCOUNTER — Other Ambulatory Visit: Payer: Self-pay | Admitting: Internal Medicine

## 2015-09-15 ENCOUNTER — Other Ambulatory Visit: Payer: Self-pay | Admitting: Physician Assistant

## 2015-09-15 DIAGNOSIS — I370 Nonrheumatic pulmonary valve stenosis: Secondary | ICD-10-CM

## 2015-09-15 DIAGNOSIS — I281 Aneurysm of pulmonary artery: Secondary | ICD-10-CM

## 2015-09-16 ENCOUNTER — Other Ambulatory Visit: Payer: Self-pay

## 2015-09-16 ENCOUNTER — Ambulatory Visit (HOSPITAL_BASED_OUTPATIENT_CLINIC_OR_DEPARTMENT_OTHER): Payer: Medicaid Other

## 2015-09-16 ENCOUNTER — Ambulatory Visit (HOSPITAL_COMMUNITY)
Admission: RE | Admit: 2015-09-16 | Discharge: 2015-09-16 | Disposition: A | Discharge status: Home / Self Care | Payer: Medicaid Other | Source: Ambulatory Visit | Attending: Cardiology | Admitting: Cardiology

## 2015-09-16 DIAGNOSIS — Q211 Atrial septal defect, unspecified: Secondary | ICD-10-CM

## 2015-09-16 DIAGNOSIS — I370 Nonrheumatic pulmonary valve stenosis: Secondary | ICD-10-CM

## 2015-09-16 DIAGNOSIS — F840 Autistic disorder: Secondary | ICD-10-CM | POA: Diagnosis not present

## 2015-09-16 DIAGNOSIS — F71 Moderate intellectual disabilities: Secondary | ICD-10-CM | POA: Diagnosis not present

## 2015-09-16 DIAGNOSIS — I340 Nonrheumatic mitral (valve) insufficiency: Secondary | ICD-10-CM | POA: Insufficient documentation

## 2015-09-16 DIAGNOSIS — I281 Aneurysm of pulmonary artery: Secondary | ICD-10-CM

## 2015-09-16 DIAGNOSIS — I071 Rheumatic tricuspid insufficiency: Secondary | ICD-10-CM | POA: Insufficient documentation

## 2015-09-16 DIAGNOSIS — I428 Other cardiomyopathies: Secondary | ICD-10-CM

## 2015-09-16 MED ORDER — GADOBENATE DIMEGLUMINE 529 MG/ML IV SOLN
20.0000 mL | Freq: Once | INTRAVENOUS | Status: AC | PRN
  Administered 2015-09-16: 20 mL via INTRAVENOUS

## 2015-09-19 ENCOUNTER — Encounter: Payer: Self-pay | Admitting: Physician Assistant

## 2015-09-22 ENCOUNTER — Telehealth: Payer: Self-pay | Admitting: *Deleted

## 2015-09-22 NOTE — Telephone Encounter (Signed)
Notes Recorded by Laurey Morale, MD on 09/19/2015 at 4:04 PM PS is stable and mild and RV looks ok, so just repeat at a year.  09/22/15 pt's mother given echo results done 09/16/15

## 2015-10-04 NOTE — Progress Notes (Signed)
Cardiology Office Note   Date:  10/05/2015   ID:  [name], DOB 02-09-1986, MRN [id number]   Patient Care Team: Raliegh Ip, DO as PCP - General (Family Medicine) Laurey Morale, MD as Consulting Physician (Cardiology)    Chief Complaint  Patient presents with  . Follow-up  . Atrial Septal Defect  . Pulmonic Stenosis     History of Present Illness: [name] is a 29 y.o. 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 Patient was followed in the past by a pediatric cardiologist then by Dr. Reyes Ivan at [facility]. He had not had cardiology followup for several years prior to his initial appointment with Dr. Shirlee Latch. Echo in 2005 showed normal LVEF but questioned noncompaction towards the apex, mild pulmonic stenosis, small secundum ASD, and normal tricuspid valve. 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   Patient is relatively high-functioning for autism. He reads and writes and has a relatively good memory per his mother. He has trouble with social interactions. 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  He participates in the [employer] and skates, bowls, swims, and plays basketball.   In 10/13, echo demonstrated LVEF was normal and there appeared to be mild PS with mean gradient 18 mmHg, the main PA was moderately dilated. The atrial septum and RV were not well-visualized. 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 Given the ASD and RV dilation, TEE was done. This confirmed at 1.5 cm secundum ASD with left to right flow and RV dilation. He had percutaneous ASD closure in 1/14 due to enlarged RV. This was  successful. Cath prior to closure showed Qp/Qs 1.9/1. Echo after procedure in 2/14 showed EF 50-55%, mild MR, intact ASD closure device, mild PS with peak gradient 17 mmHg. MRA chest in 10/14 showed stable 4.6 cm main PA.   Last seen in this clinic by me in 08/2014.  He returns for FU.  MRA was done 09/16/15 and demonstrate stable aneurysmal dilatation of the main Pa with max diam of 4.5-4.6 cm and stable Amplatzer septal occluder.  Echo was done 09/16/15 and demonstrated normal EF, mod diastolic dysfunction, stable ASD closure device with no shunt, and stable mild PS.  Studies were reviewed with Dr. Marca Ancona who felt they were stable.  His studies will be repeated in 1 year.  Returns for FU. Here with his mom and brother.  He still goes to a day program. He likes to go bowling and is currently participating in roller skating in the [employer]. He will participate in downhill skiing next month. He denies chest pain, dyspnea, syncope, edema. He has a lot of anxiety and has been punching himself in the chest.    Studies/Reports Reviewed Today:  Mr Angiogram Chest W Wo Contrast  09/16/2015   IMPRESSION: 1. 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 2. Focal susceptibility artifact at the inter atrial septum consistent with the presence of an Amplatzer septal occluder device. Signed, Sterling Big, MD [facility] Electronically Signed   By: Malachy Moan  M.D.   On: 09/16/2015 16:21    Echo 09/16/15 EF 55-60%, no RWMA, Gr 2 DD, mild MR, s/p ASD closure with no evidence of shunt by color flow doppler, trivial TR, mild PS, trivial PI.  Echo (10/15):  EF 50-55%, normal wall motion, severe LVH and prominent trabeculations in the apex suspicious for non-compaction CM, ASD occluder okay (no residual shunt), mild TR, mild pulmonic stenosis (mean gradient 5 mm Hg)   Past  Medical History  Diagnosis Date  . Pulmonary artery stenosis, main   . Autism disorder   . Mental retardation   . ASD (atrial septal defect)     a. 11/2012 ASD closure of ostium secundum ASD w/ 16mm Amplatzer septal occluder device;  b. 11/2012 Echo: EF 55-60%, mild MR, Triv PS, ASD closure device in place.  Marland Kitchen History of echocardiogram     Echo 11/16: EF 55-60%, no RWMA, Gr 2 DD, mild MR, s/p ASD closure with no evidence of shunt, trivial TR, mild Pulmonic stenosis, trivial PI  1. Chromosome 8p deletion syndrome: Autism + cardiac anomalies. 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 He is followed by a geneticist at [facility].  2. Congenital heart disease: Echo in 2005 showed EF 55-60% with ? 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 Echo (10/13) was a difficult study with EF 60-65%, mild MR, mild PS peak gradient 18 mmHg, dilated main PA. Cardiac MRI (10/13) with LV EF 55%, prominent trabeculations LV apex suggestive of noncompaction, minimal PI but suspect mild PS, moderate RV dilation with EF 45% (RV EDVi 155 ml/m2), main PA dilated to 4.6 x 4.4 cm, secundum ASD, hard to tell size on MRI, Qp/Qs by MRI was 1.4/1. TEE (12/13): EF 55%, LV noncompaction, RV mild-moderately dilated with normal systolic function, 1.5 cm secundum ASD with left to right flow, no significant PI, unable to measure PV gradient but does not look like significant PS. Echo after ASD closure in (2/14) with EF 50-55%, mild MR, intact ASD closure device, mild PS with peak gradient 17 mmHg.  Summary:  - Mild PS  - Secundum ASD with dilated RV. ASD was closed percutaneously on 1/14. Qp/Qs on cath prior to procedure was 1.9/1.  - LV noncompaction with normal EF  - Pulmonary artery  aneurysm. MRA chest in 10/14 showed stable 4.6 cm main PA dilation.  Echo (10/15):  EF 50-55%, normal wall motion, severe LVH and prominent trabeculations in the apex suspicious for non-compaction CM, ASD occluder okay (no residual shunt), mild TR, mild pulmonic stenosis (mean gradient 5 mm Hg) Mr Angiogram Chest W Wo Contrast  09/16/2015   IMPRESSION: 1. 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 2. Focal susceptibility artifact at the inter atrial septum consistent with the presence of an Amplatzer septal occluder device. Signed, Sterling Big, MD [facility] Electronically Signed   By: Malachy Moan M.D.   On: 09/16/2015 16:21  Echo 09/16/15 EF 55-60%, no RWMA, Gr 2 DD, mild MR, s/p ASD closure with no evidence of shunt by color flow doppler, trivial TR, mild PS, trivial PI. 3. Autism with moderate mental retardation.  4.  Allergic rhinitis  Past Surgical History  Procedure Laterality Date  . Tonsillectomy    . Tee without cardioversion  10/23/2012    Procedure: TRANSESOPHAGEAL ECHOCARDIOGRAM (TEE);  Surgeon: Laurey Morale, MD;  Location: [facility] ENDOSCOPY;  Service: Cardiovascular;  Laterality: N/A;  Dr. requesting [name]. come over to push propofol/ pt is hard to sedate  . Cardiac surgery    . Transesophageal echocardiogram w/o cardioversion N/A 11/24/2012    Procedure: TRANSESOPHAGEAL ECHOCARDIOGRAM W/O CARDIOVERSION;  Surgeon: Tonny Bollman, MD;  Location: [facility] CATH LAB;  Service: Cardiovascular;  Laterality: N/A;  . Asd repair N/A 11/24/2012    Procedure: ATRIAL SEPTAL DEFECT (ASD) REPAIR;  Surgeon: Tonny Bollman, MD;  Location: [facility] CATH LAB;  Service: Cardiovascular;  Laterality: N/A;     Current Outpatient Prescriptions  Medication Sig Dispense Refill  . aspirin EC 81 MG tablet Take ONE tablet by mouth each morning 30 tablet 11  . cetirizine (ZYRTEC) 10 MG tablet  Take ONE tablet by mouth once daily 30 tablet 11  . fluticasone (FLONASE) 50 MCG/ACT nasal spray Place 2 sprays into both nostrils daily. 16 g 1  . hydrocortisone cream 1 % Apply to affected area 2 times daily 30 g 1  . ketoconazole (NIZORAL) 2 % cream Apply 1 application topically daily. 15 g 2  . ketoconazole (NIZORAL) 2 % shampoo Apply 1 application topically 2 (two) times a week. 120 mL 0  . montelukast (SINGULAIR) 10 MG tablet Take ONE tablet by mouth at bedtime 30 tablet 11   No current facility-administered medications for this visit.    Allergies:   Sulfa antibiotics    Social History:   Social History   Social History  . Marital Status: Single    Spouse Name: N/A  . Number of Children: N/A  . Years of Education: N/A   Social History Main Topics  . Smoking status: Never Smoker   . Smokeless tobacco: Never Used  . Alcohol Use: No  . Drug Use: No  . Sexual Activity: No   Other Topics Concern  . None   Social History Narrative     Family History:   Family History  Problem Relation Age of Onset  . Heart attack Neg Hx   . Stroke Neg Hx   . Hypertension Mother   . Diabetes Mother       ROS:   Please see the history of present illness.   Review of Systems  Psychiatric/Behavioral: The patient is nervous/anxious.   All other systems reviewed and are negative.     PHYSICAL EXAM: VS:  BP 112/78 mmHg  Pulse 68  Ht  (1.854 m)  Wt 215 lb (97.523 kg)  BMI 28.37 kg/m2    Wt Readings from Last 3 Encounters:  10/05/15 215 lb (97.523 kg)  09/09/15 212 lb (96.163 kg)  04/12/15 222 lb (100.699 kg)     GEN: Well nourished, well developed, in no acute distress HEENT: normal Neck: no JVD, no carotid bruits, no masses Cardiac:  Normal S1/S2, RRR; 1/6 early systolic murmur LUSB,  no rubs or gallops, no edema   Respiratory:  clear to auscultation bilaterally, no wheezing, rhonchi or rales. GI: soft, nontender, nondistended, + BS MS: no deformity or  atrophy Skin: warm and dry  Neuro:  CNs II-XII intact, Strength and sensation are intact Psych: Normal affect   EKG:  EKG is ordered today.  It demonstrates:   NSR, HR 68, nonspecific ST-T wave changes, QTc 414 ms, no change from  prior tracing   Recent Labs: No results found for requested labs within last 365 days.    Lipid Panel No results found for: CHOL, TRIG, HDL, CHOLHDL, VLDL, LDLCALC, LDLDIRECT    ASSESSMENT AND PLAN:  1.ASD (atrial septal defect): Recent echo with well seated ASD occluder.  Repeat echo in 1 year.   2.Left ventricular non-compaction: Recent echo stable.  Repeat echo in 1 year.    3.Pulmonic stenosis: Mild by recent echo. Repeat Echo 1 year.  4.Aneurysm of pulmonary artery: Stable dilation on recent MRA. Repeat in 1 year.   5. Anxiety:  Reassurance that he cannot affect his ASD closure device with punching himself.  But, I did recommend FU with PCP to address anxiety issues.      Medication Changes: Current medicines are reviewed at length with the patient today.  Concerns regarding medicines are as outlined above.  The following changes have been made:   Discontinued Medications   No medications on file   Modified Medications   Modified Medication Previous Medication   ASPIRIN EC 81 MG TABLET aspirin EC 81 MG tablet      Take ONE tablet by mouth each morning    Take ONE tablet by mouth each morning   New Prescriptions   No medications on file   Labs/ tests ordered today include:   Orders Placed This Encounter  Procedures  . MR Angiogram Chest W Wo Contrast  . EKG 12-Lead  . Echocardiogram     Disposition:    FU with Dr. Marca Ancona 1 year after echo and MRA.     Signed, Brynda Rim, MHS 10/05/2015 12:56 PM    [facility] [address] [place], [place], [place]  [address] Phone: [phone]; Fax: [phone]

## 2015-10-05 ENCOUNTER — Encounter: Payer: Self-pay | Admitting: Physician Assistant

## 2015-10-05 ENCOUNTER — Ambulatory Visit (INDEPENDENT_AMBULATORY_CARE_PROVIDER_SITE_OTHER): Payer: Medicaid Other | Admitting: Physician Assistant

## 2015-10-05 VITALS — BP 112/78 | HR 68 | Ht 73.0 in | Wt 215.0 lb

## 2015-10-05 DIAGNOSIS — Q211 Atrial septal defect, unspecified: Secondary | ICD-10-CM

## 2015-10-05 DIAGNOSIS — F419 Anxiety disorder, unspecified: Secondary | ICD-10-CM

## 2015-10-05 DIAGNOSIS — I428 Other cardiomyopathies: Secondary | ICD-10-CM

## 2015-10-05 DIAGNOSIS — I370 Nonrheumatic pulmonary valve stenosis: Secondary | ICD-10-CM

## 2015-10-05 DIAGNOSIS — I281 Aneurysm of pulmonary artery: Secondary | ICD-10-CM

## 2015-10-05 MED ORDER — ASPIRIN EC 81 MG PO TBEC: DELAYED_RELEASE_TABLET | ORAL | Status: DC

## 2015-10-05 NOTE — Patient Instructions (Signed)
Medication Instructions:  A REFILL FOR ASPIRIN HAS BEEN SENT IN  Labwork: NONE  Testing/Procedures: 1. 11/207 YOU WILL NEED A MRA CHEST WITH AND W/O CONTRAST TO BE DONE AT [facility]  2. 09/2016 YOU WILL NEED AN ECHO  Follow-Up: DR. Shirlee Latch IN 1 YEAR  Any Other Special Instructions Will Be Listed Below (If Applicable).  If you need a refill on your cardiac medications before your next appointment, please call your pharmacy.

## 2015-10-08 ENCOUNTER — Other Ambulatory Visit: Payer: Self-pay | Admitting: Internal Medicine

## 2015-10-24 ENCOUNTER — Other Ambulatory Visit: Payer: Self-pay | Admitting: Internal Medicine

## 2015-12-08 ENCOUNTER — Ambulatory Visit (INDEPENDENT_AMBULATORY_CARE_PROVIDER_SITE_OTHER): Payer: Medicaid Other | Admitting: Family Medicine

## 2015-12-08 ENCOUNTER — Encounter: Payer: Self-pay | Admitting: Family Medicine

## 2015-12-08 VITALS — BP 120/64 | HR 83 | Temp 97.7°F | Ht 73.0 in | Wt 211.6 lb

## 2015-12-08 DIAGNOSIS — J069 Acute upper respiratory infection, unspecified: Secondary | ICD-10-CM | POA: Diagnosis not present

## 2015-12-08 MED ORDER — BENZONATATE 100 MG PO CAPS: 100.0000 mg | ORAL_CAPSULE | Freq: Two times a day (BID) | ORAL | Status: DC | PRN

## 2015-12-08 NOTE — Patient Instructions (Signed)
Cough Treatment - you should: -  Take over-the-counter ibuprofen or Tylenol as directed on the bottles for fever, pain, and/or inflammation. -  You can pickup some pseudoephedrine (Sudafed) from your local pharmacy. This may help with most of your symptoms during the day. However, I would recommend not taking this before bedtime as it has a tendency to keep people awake.  Also, only take this when surely necessary as it can cause some "rebound effects" with worsening congestion and runny nose. -   Over-the-counter nasal saline spray may also help with much of the nasal irritation/congestion you may have.   You should be better in: 5-7 days Call us if you have severe shortness of breath, high fever or are not better in 2 weeks

## 2015-12-08 NOTE — Assessment & Plan Note (Signed)
Patient is here presenting with signs and symptoms consistent with URI. Etiology likely viral in nature as symptoms have only been present for 3 days. Patient currently lives in a group home in which many of the residents have come down with similar symptoms over the past month. According to his mother he is the "last one" of the group home to come down with this illness. Patient is currently afebrile and breathing comfortably. - Tylenol PRN for fever and discomfort. - Nasal saline wash for congestion as needed - Encouraged pushing fluids with water and Gatorade. - Tessalon Perles prescribed. - Decongestants to be used only as needed and as infrequently as possible. - F/u PRN  Side note: Discussed impending cerumen impaction. Mother states that he schedules regular visits to have bilateral cleanouts. She states that he'll have one in the future.

## 2015-12-08 NOTE — Progress Notes (Signed)
URI Head cold, significant rhinorrhea. Lives in group home, "everyone has gotten sick". Congestion. No HA, n/v/d, fever or chills. Started 1/31.  Has been sick for 3 days. Nasal discharge: yes Medications tried: cough syrup, zyrtec, singulair, flonase Sick contacts: yes, residents in group home and staff  Symptoms Fever: no Headache or face pain: no Tooth pain: no Sneezing: yes Scratchy throat: no Allergies: yes Muscle aches: no Severe fatigue: no Stiff neck: no Shortness of breath: no Rash: no Sore throat or swollen glands: no   ROS see HPI Smoking Status noted  Objective: BP 120/64 mmHg  Pulse 83  Temp(Src) 97.7 F (36.5 C) (Oral)  Ht 6\' 1"  (1.854 m)  Wt 211 lb 9.6 oz (95.981 kg)  BMI 27.92 kg/m2 Gen: NAD, alert, cooperative, and pleasant. Mentally Handicapped. HEENT: NCAT, EOMI, PERRL, dry mucous membranes, TMs clear (some developing cerumen impaction), no LAD, OP erythematous without exudate, nasal mucosa edematous with obvious clear rhinorrhea present. CV: RRR, systolic murmur noted Resp: CTAB, no wheezes, non-labored Ext: No edema, warm   Assessment and plan:  URI (upper respiratory infection) Patient is here presenting with signs and symptoms consistent with URI. Etiology likely viral in nature as symptoms have only been present for 3 days. Patient currently lives in a group home in which many of the residents have come down with similar symptoms over the past month. According to his mother he is the "last one" of the group home to come down with this illness. Patient is currently afebrile and breathing comfortably. - Tylenol PRN for fever and discomfort. - Nasal saline wash for congestion as needed - Encouraged pushing fluids with water and Gatorade. - Tessalon Perles prescribed. - Decongestants to be used only as needed and as infrequently as possible. - F/u PRN  Side note: Discussed impending cerumen impaction. Mother states that he schedules regular  visits to have bilateral cleanouts. She states that he'll have one in the future.   Meds ordered this encounter  Medications  . benzonatate (TESSALON) 100 MG capsule    Sig: Take 1 capsule (100 mg total) by mouth 2 (two) times daily as needed for cough.    Dispense:  20 capsule    Refill:  0     Mark Delton, MD,MS,  PGY2 12/08/2015 6:08 PM

## 2015-12-13 ENCOUNTER — Other Ambulatory Visit: Payer: Self-pay | Admitting: Internal Medicine

## 2016-01-04 ENCOUNTER — Other Ambulatory Visit: Payer: Self-pay | Admitting: Family Medicine

## 2016-01-06 ENCOUNTER — Other Ambulatory Visit: Payer: Self-pay | Admitting: *Deleted

## 2016-01-06 DIAGNOSIS — L219 Seborrheic dermatitis, unspecified: Secondary | ICD-10-CM

## 2016-01-09 MED ORDER — HYDROCORTISONE 1 % EX CREA: TOPICAL_CREAM | CUTANEOUS | Status: DC

## 2016-02-03 ENCOUNTER — Other Ambulatory Visit: Payer: Self-pay | Admitting: Family Medicine

## 2016-03-09 ENCOUNTER — Other Ambulatory Visit: Payer: Self-pay | Admitting: Family Medicine

## 2016-03-12 ENCOUNTER — Other Ambulatory Visit: Payer: Self-pay | Admitting: Family Medicine

## 2016-03-13 ENCOUNTER — Ambulatory Visit (INDEPENDENT_AMBULATORY_CARE_PROVIDER_SITE_OTHER): Payer: Medicaid Other | Admitting: Family Medicine

## 2016-03-13 ENCOUNTER — Encounter: Payer: Self-pay | Admitting: Family Medicine

## 2016-03-13 VITALS — BP 122/82 | HR 69 | Temp 97.3°F | Ht 73.0 in | Wt 212.2 lb

## 2016-03-13 DIAGNOSIS — L853 Xerosis cutis: Secondary | ICD-10-CM | POA: Diagnosis not present

## 2016-03-13 DIAGNOSIS — L218 Other seborrheic dermatitis: Secondary | ICD-10-CM | POA: Diagnosis not present

## 2016-03-13 DIAGNOSIS — L851 Acquired keratosis [keratoderma] palmaris et plantaris: Secondary | ICD-10-CM

## 2016-03-13 DIAGNOSIS — L219 Seborrheic dermatitis, unspecified: Secondary | ICD-10-CM

## 2016-03-13 DIAGNOSIS — L309 Dermatitis, unspecified: Secondary | ICD-10-CM

## 2016-03-13 DIAGNOSIS — R234 Changes in skin texture: Secondary | ICD-10-CM

## 2016-03-13 MED ORDER — HYDROCORTISONE 0.5 % EX OINT: TOPICAL_OINTMENT | CUTANEOUS | Status: DC

## 2016-03-13 MED ORDER — KETOCONAZOLE 2 % EX SHAM: MEDICATED_SHAMPOO | CUTANEOUS | Status: DC

## 2016-03-13 NOTE — Progress Notes (Signed)
    Subjective: CC: med refills HPI: [name] is a 30 y.o. male presenting to clinic today for office visit. Concerns today include:  Skin issues Caretaker from group home notes that patient has had thickened/ dry skin on his hands for several years.  They have been applying hydrocortisone and ketoconazole cream to his hands daily for >10 years.  She notes that aquaphor did not help hands.  She denies that he has itching/ bleeding from hands.  She reports that he has frequent violent outbursts and hits his hands a lot.  She attributes the callous formation to this.  Additionally, she notes that he has marked dandruff and greasy scaly skin around his eyerbrows.  Ketoconazole shampoo has helped with this.  Social History Reviewed: non smoker. FamHx and MedHx reviewed.  Please see EMR. Health Maintenance: annual exam June  ROS: Per HPI  Objective: Office vital signs reviewed. BP 122/82 mmHg  Pulse 69  Temp(Src) 97.3 F (36.3 C) (Oral)  Ht 6\' 1"  (1.854 m)  Wt 212 lb 3.2 oz (96.253 kg)  BMI 28.00 kg/m2  Physical Examination:  General: Awake, alert, well nourished, syndromic, intermittent outbursts during visit, No acute distress Skin: dry, callous formations on ulnar aspects of bilateral hands and interdigitally as well.  Mild cracking of skin on palmar aspect of bilateral hands. No bleeding. No evidence of infection.  Skin surround eyebrows greasy appearing but no scaling on my exam.   Assessment/ Plan: 30 y.o. male   1. Seborrheic dermatitis of scalp.  Agree with continued use of Ketoconazole shampoo prn dandruff - ketoconazole (NIZORAL) 2 % shampoo; APPLY 1 APPLICATION TOPICALLY TWO TIMES A WEEK  Dispense: 120 mL; Refill: 5  2. Dry skin.  Had a long discussion regarding continued need for ketoconazole and steroid on hands/ knees.  Do not think that these are appropriate medications for this condition.  While, hydrocortisone cream is likely not a huge offender, my fear is  that continued used of a topical steroid may impact patient's adrenal axis.  Will wean patient off of hydrocortisone ointment. - Discussed proper skin care, see AVS - hydrocortisone ointment 0.5 %; Apply to affected areas twice daily x1 week.  Then once daily x1 week.  Then every other day x1 week.  Then stop.  Dispense: 60 g; Refill: 0 - Patient to use good emollient on hands like Vaseline.  Avoid face  3. Thickened skin of palms and soles - as above - hydrocortisone ointment 0.5 %; Apply to affected areas twice daily x1 week.  Then once daily x1 week.  Then every other day x1 week.  Then stop.  Dispense: 60 g; Refill: 0  4. Eczema.  No flares on today's exam - hydrocortisone ointment 0.5 %; Apply to affected areas twice daily x1 week.  Then once daily x1 week.  Then every other day x1 week.  Then stop.  Dispense: 60 g; Refill: 0  Precepted with Dr Elissa Hefty, DO PGY-2, [facility]

## 2016-03-13 NOTE — Patient Instructions (Signed)
STOP Ketoconazole cream applied to hands STOP Hydrocortisone cream applied to hands START Hydrocortisone OINTMENT applied to hands.      -Apply twice daily to hands for 1 week      -Apply once daily to hands for 1 week.      -Apply every other day to hands for 1 week. Then stop. During this time, you will be applying vaseline or an ointment based moisturizer to hands and knees.  Apply twice daily.  Avoid face.  Continue using Ketoconazole SHAMPOO twice weekly for dandruff.  Basic Skin Care Your child's skin plays an important role in keeping the entire body healthy.  Below are some tips on how to try and maximize skin health from the outside in.  1) Bathe in mildly warm water every 1 to 3 days, followed by light drying and an application of a thick moisturizer cream or ointment, preferably one that comes in a tub. a. Fragrance free moisturizing bars or body washes are preferred such as Purpose, Cetaphil, Dove sensitive skin, Aveeno, [employer] or Vanicream products. b. Use a fragrance free cream or ointment, not a lotion, such as plain petroleum jelly or Vaseline ointment, Aquaphor, Vanicream, Eucerin cream or a generic version, CeraVe Cream, Cetaphil Restoraderm, Aveeno Eczema Therapy and [employer], among others. c. Children with very dry skin often need to put on these creams two, three or four times a day.  As much as possible, use these creams enough to keep the skin from looking dry. d. Consider using fragrance free/dye free detergent, such as Arm and Hammer for sensitive skin, Tide Free or All Free.   2) If I am prescribing a medication to go on the skin, the medicine goes on first to the areas that need it, followed by a thick cream as above to the entire body.  3) Wynelle Link is a major cause of damage to the skin. a. I recommend sun protection for all of my patients. I prefer physical barriers such as hats with wide brims that cover the ears, long sleeve clothing with SPF  protection including rash guards for swimming. These can be found seasonally at outdoor clothing companies, [employer] and [employer] and online at Liz Claiborne.com, www.uvskinz.com and BrideEmporium.nl. Avoid peak sun between the hours of 10am to 3pm to minimize sun exposure.  b. I recommend sunscreen for all of my patients older than 59 months of age when in the sun, preferably with broad spectrum coverage and SPF 30 or higher.  i. For children, I recommend sunscreens that only contain titanium dioxide and/or zinc oxide in the active ingredients. These do not burn the eyes and appear to be safer than chemical sunscreens. These sunscreens include zinc oxide paste found in the diaper section, Vanicream Broad Spectrum 50+, Aveeno Natural Mineral Protection, Neutrogena Pure and Free Baby, [employer] Daily face and body lotion, [employer], among others. ii. There is no such thing as waterproof sunscreen. All sunscreens should be reapplied after 60-80 minutes of wear.  iii. Spray on sunscreens often use chemical sunscreens which do protect against the sun. However, these can be difficult to apply correctly, especially if wind is present, and can be more likely to irritate the skin.  Long term effects of chemical sunscreens are also not fully known.

## 2016-03-15 ENCOUNTER — Other Ambulatory Visit: Payer: Self-pay | Admitting: Family Medicine

## 2016-03-20 IMAGING — MR MR MRA CHEST W/ OR W/O CM
14 series · 16 of 16 positions shown · IV contrast (multihance)
Comparison: Prior MRA of the chest 08/05/2014

CLINICAL DATA: 29-year-old male with chromosome 8p deletion
syndrome involving autism with moderate mental retardation, cardiac
anomalies, pulmonic stenosis and pulmonary artery aneurysm as well
as left ventricle non compaction. Prior ASD repair with Amplatzer
septal occluder device.

EXAM:
MRA CHEST WITH OR WITHOUT CONTRAST
TECHNIQUE: Angiographic images of the chest were obtained using MRA technique
without and with intravenous contrast.
CONTRAST:  20mL MULTIHANCE GADOBENATE DIMEGLUMINE 529 MG/ML IV SOLN

[Series 4: bSSFP · sagittal · 8.0mm · 1.37mm/px · 1 of 17 slices shown (1 of 2)]
[im 1/17]
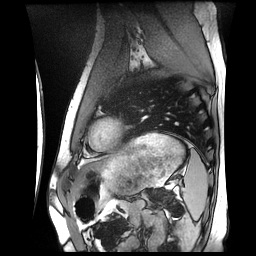

[Series 5: T1 · axial · 8.0mm · 0.74mm/px · 1 of 20 slices shown]
[im 1/20]
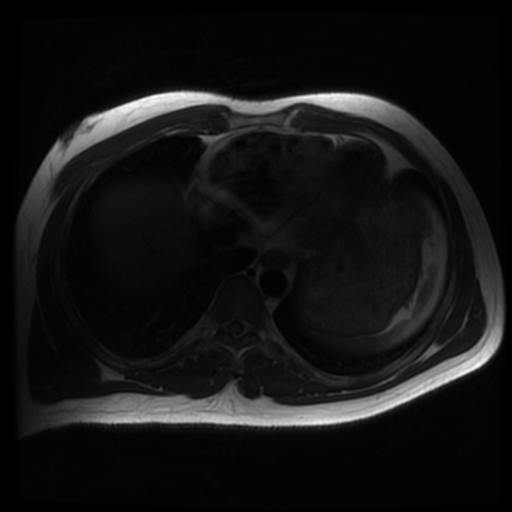

[Series 6: bSSFP · sagittal · 8.0mm · 1.37mm/px · 3 of 260 slices shown (2 of 2)]
[im 1/260]
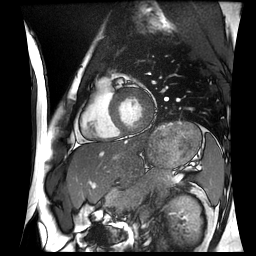
[im 130/260]
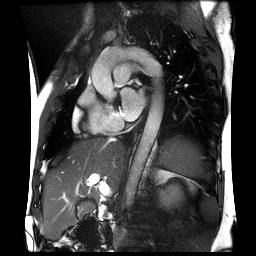
[im 260/260]
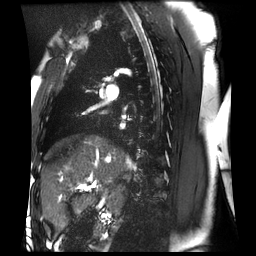

[Series 7: T1 dynamic · axial · non-contrast · 5.0mm · 0.78mm/px · 1 of 72 slices shown (1 of 2)]
[im 1/72]
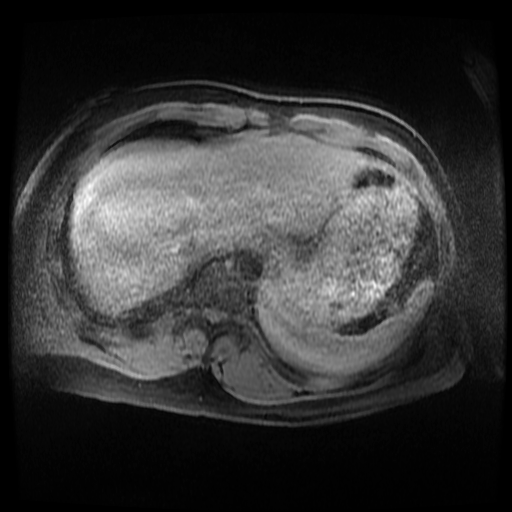

[Series 8: T1 dynamic · coronal · non-contrast · 5.0mm · 0.86mm/px · 1 of 64 slices shown (2 of 2)]
[im 1/64]
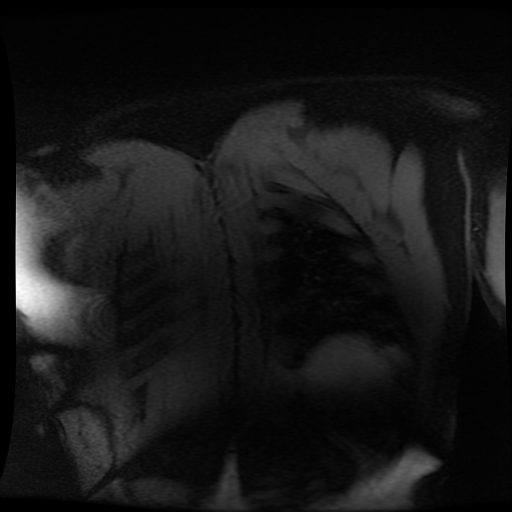

[Series 9: tricks chest · coronal · 2.6mm · 1.56mm/px · 1 of 100 slices shown]
[im 1/100]
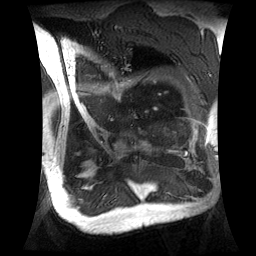

[Series 10: T1 dynamic post-contrast · axial · 5.0mm · 0.78mm/px · 1 of 72 slices shown (1 of 2)]
[im 1/72]
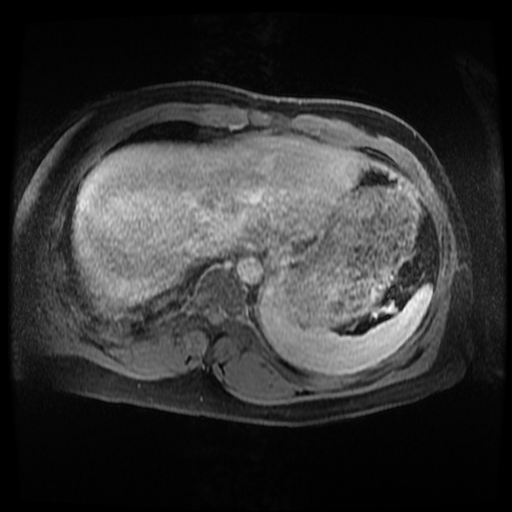

[Series 11: T1 dynamic post-contrast · coronal · 5.0mm · 0.86mm/px · 1 of 64 slices shown (2 of 2)]
[im 1/64]
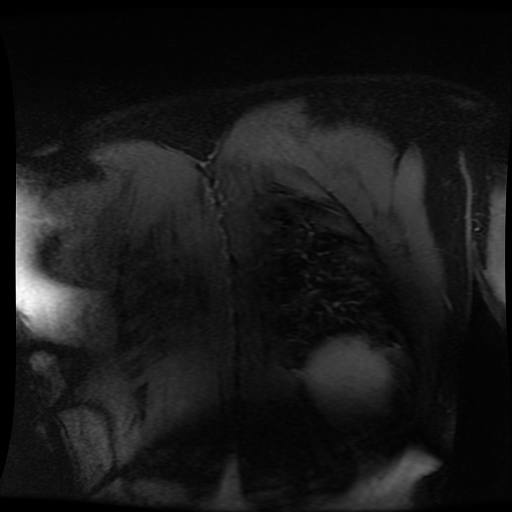

[Series 900: sub:tricks chest · coronal · 2.6mm · 1.56mm/px · 1 of 5 slices shown]
[im 1/5]
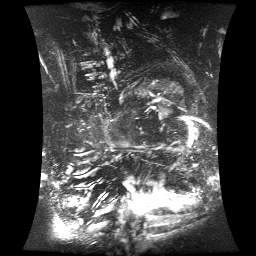

[Series 901: ph1:sub:tricks chest · coronal · 2.6mm · 1.56mm/px · 1 of 100 slices shown]
[im 1/100]
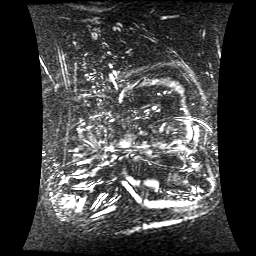

[Series 902: ph2:sub:tricks chest · coronal · 2.6mm · 1.56mm/px · 1 of 100 slices shown]
[im 1/100]
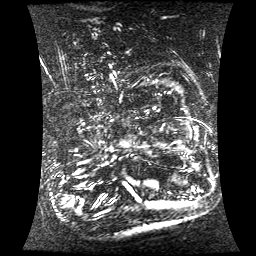

[Series 903: ph3:sub:tricks chest · coronal · 2.6mm · 1.56mm/px · 1 of 100 slices shown]
[im 1/100]
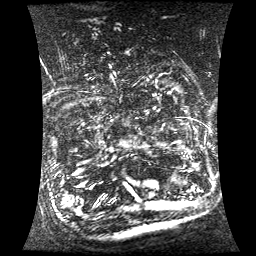

[Series 904: ph4:sub:tricks chest · coronal · 2.6mm · 1.56mm/px · 1 of 100 slices shown]
[im 1/100]
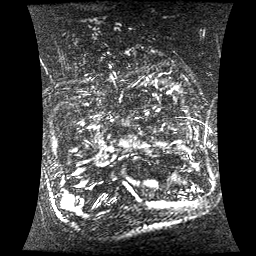

[Series 905: ph5:sub:tricks chest · coronal · 2.6mm · 1.56mm/px · 1 of 100 slices shown]
[im 1/100]
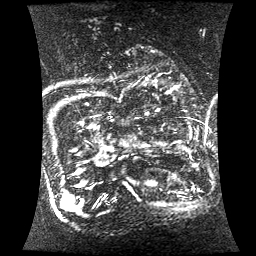

[16 of 16 positions shown; findings below may reference images not displayed]

FINDINGS: VASCULAR

Stable aneurysmal dilatation of the main pulmonary artery with a
maximal diameter of 4.5-4.6 cm. Left main pulmonary artery measures
up to 2.3 cm in diameter, unchanged. The right main pulmonary artery
measures up to 21 mm, similar compared to 19 mm previously. Two
vessel aortic arch. The brachiocephalic and left common carotid
arteries share a common origin. No evidence of pulmonary embolus,
aortic aneurysm or dissection. The heart is within normal limits for
size. Focal susceptibility artifact in the region of the inter
atrial septum likely secondary to the presence of the Amplatzer
septal occluder device.

NON VASCULAR

No focal signal abnormality or abnormal enhancement visualized
within the thorax or upper abdomen.
IMPRESSION: 1. Stable aneurysmal dilatation of the main pulmonary artery with a
maximal diameter of 4.5- 4.6 cm.
2. Focal susceptibility artifact at the inter atrial septum
consistent with the presence of an Amplatzer septal occluder device.

## 2016-04-23 ENCOUNTER — Ambulatory Visit (INDEPENDENT_AMBULATORY_CARE_PROVIDER_SITE_OTHER): Payer: Medicaid Other | Admitting: Family Medicine

## 2016-04-23 ENCOUNTER — Encounter: Payer: Self-pay | Admitting: Family Medicine

## 2016-04-23 VITALS — BP 122/75 | HR 73 | Temp 97.8°F | Ht 73.0 in | Wt 208.4 lb

## 2016-04-23 DIAGNOSIS — L732 Hidradenitis suppurativa: Secondary | ICD-10-CM | POA: Diagnosis not present

## 2016-04-23 DIAGNOSIS — Q221 Congenital pulmonary valve stenosis: Secondary | ICD-10-CM

## 2016-04-23 DIAGNOSIS — Z114 Encounter for screening for human immunodeficiency virus [HIV]: Secondary | ICD-10-CM | POA: Diagnosis not present

## 2016-04-23 DIAGNOSIS — Z0000 Encounter for general adult medical examination without abnormal findings: Secondary | ICD-10-CM | POA: Diagnosis not present

## 2016-04-23 DIAGNOSIS — F418 Other specified anxiety disorders: Secondary | ICD-10-CM

## 2016-04-23 MED ORDER — BENZOYL PEROXIDE 10 % EX LIQD: CUTANEOUS | Status: DC

## 2016-04-23 MED ORDER — LORAZEPAM 0.5 MG PO TABS: ORAL_TABLET | ORAL | Status: DC

## 2016-04-23 NOTE — Patient Instructions (Addendum)
Health Maintenance, Male A healthy lifestyle and preventative care can promote health and wellness.  Maintain regular health, dental, and eye exams.  Eat a healthy diet. Foods like vegetables, fruits, whole grains, low-fat dairy products, and lean protein foods contain the nutrients you need and are low in calories. Decrease your intake of foods high in solid fats, added sugars, and salt. Get information about a proper diet from your health care provider, if necessary.  Regular physical exercise is one of the most important things you can do for your health. Most adults should get at least 150 minutes of moderate-intensity exercise (any activity that increases your heart rate and causes you to sweat) each week. In addition, most adults need muscle-strengthening exercises on 2 or more days a week.   Maintain a healthy weight. The body mass index (BMI) is a screening tool to identify possible weight problems. It provides an estimate of body fat based on height and weight. Your health care provider can find your BMI and can help you achieve or maintain a healthy weight. For males 20 years and older:  A BMI below 18.5 is considered underweight.  A BMI of 18.5 to 24.9 is normal.  A BMI of 25 to 29.9 is considered overweight.  A BMI of 30 and above is considered obese.  Maintain normal blood lipids and cholesterol by exercising and minimizing your intake of saturated fat. Eat a balanced diet with plenty of fruits and vegetables. Blood tests for lipids and cholesterol should begin at age 24 and be repeated every 5 years. If your lipid or cholesterol levels are high, you are over age 36, or you are at high risk for heart disease, you may need your cholesterol levels checked more frequently.Ongoing high lipid and cholesterol levels should be treated with medicines if diet and exercise are not working.  If you smoke, find out from your health care provider how to quit. If you do not use tobacco, do  not start.  Lung cancer screening is recommended for adults aged 39-80 years who are at high risk for developing lung cancer because of a history of smoking. A yearly low-dose CT scan of the lungs is recommended for people who have at least a 30-pack-year history of smoking and are current smokers or have quit within the past 15 years. A pack year of smoking is smoking an average of 1 pack of cigarettes a day for 1 year (for example, a 30-pack-year history of smoking could mean smoking 1 pack a day for 30 years or 2 packs a day for 15 years). Yearly screening should continue until the smoker has stopped smoking for at least 15 years. Yearly screening should be stopped for people who develop a health problem that would prevent them from having lung cancer treatment.  If you choose to drink alcohol, do not have more than 2 drinks per day. One drink is considered to be 12 oz (360 mL) of beer, 5 oz (150 mL) of wine, or 1.5 oz (45 mL) of liquor.  Avoid the use of street drugs. Do not share needles with anyone. Ask for help if you need support or instructions about stopping the use of drugs.  High blood pressure causes heart disease and increases the risk of stroke. High blood pressure is more likely to develop in:  People who have blood pressure in the end of the normal range (100-139/85-89 mm Hg).  People who are overweight or obese.  People who are African American.  If you are 74-6 years of age, have your blood pressure checked every 3-5 years. If you are 30 years of age or older, have your blood pressure checked every year. You should have your blood pressure measured twice--once when you are at a hospital or clinic, and once when you are not at a hospital or clinic. Record the average of the two measurements. To check your blood pressure when you are not at a hospital or clinic, you can use:  An automated blood pressure machine at a pharmacy.  A home blood pressure monitor.  If you are 98-66  years old, ask your health care provider if you should take aspirin to prevent heart disease.  Diabetes screening involves taking a blood sample to check your fasting blood sugar level. This should be done once every 3 years after age 20 if you are at a normal weight and without risk factors for diabetes. Testing should be considered at a younger age or be carried out more frequently if you are overweight and have at least 1 risk factor for diabetes.  Colorectal cancer can be detected and often prevented. Most routine colorectal cancer screening begins at the age of 53 and continues through age 10. However, your health care provider may recommend screening at an earlier age if you have risk factors for colon cancer. On a yearly basis, your health care provider may provide home test kits to check for hidden blood in the stool. A small camera at the end of a tube may be used to directly examine the colon (sigmoidoscopy or colonoscopy) to detect the earliest forms of colorectal cancer. Talk to your health care provider about this at age 36 when routine screening begins. A direct exam of the colon should be repeated every 5-10 years through age 22, unless early forms of precancerous polyps or small growths are found.  People who are at an increased risk for hepatitis B should be screened for this virus. You are considered at high risk for hepatitis B if:  You were born in a country where hepatitis B occurs often. Talk with your health care provider about which countries are considered high risk.  Your parents were born in a high-risk country and you have not received a shot to protect against hepatitis B (hepatitis B vaccine).  You have HIV or AIDS.  You use needles to inject street drugs.  You live with, or have sex with, someone who has hepatitis B.  You are a man who has sex with other men (MSM).  You get hemodialysis treatment.  You take certain medicines for conditions like cancer, organ  transplantation, and autoimmune conditions.  Hepatitis C blood testing is recommended for all people born from [birth year] through 1963 and any individual with known risk factors for hepatitis C.  Healthy men should no longer receive prostate-specific antigen (PSA) blood tests as part of routine cancer screening. Talk to your health care provider about prostate cancer screening.  Testicular cancer screening is not recommended for adolescents or adult males who have no symptoms. Screening includes self-exam, a health care provider exam, and other screening tests. Consult with your health care provider about any symptoms you have or any concerns you have about testicular cancer.  Practice safe sex. Use condoms and avoid high-risk sexual practices to reduce the spread of sexually transmitted infections (STIs).  You should be screened for STIs, including gonorrhea and chlamydia if:  You are sexually active and are younger than 24 years.  You  are older than 24 years, and your health care provider tells you that you are at risk for this type of infection.  Your sexual activity has changed since you were last screened, and you are at an increased risk for chlamydia or gonorrhea. Ask your health care provider if you are at risk.  If you are at risk of being infected with HIV, it is recommended that you take a prescription medicine daily to prevent HIV infection. This is called pre-exposure prophylaxis (PrEP). You are considered at risk if:  You are a man who has sex with other men (MSM).  You are a heterosexual man who is sexually active with multiple partners.  You take drugs by injection.  You are sexually active with a partner who has HIV.  Talk with your health care provider about whether you are at high risk of being infected with HIV. If you choose to begin PrEP, you should first be tested for HIV. You should then be tested every 3 months for as long as you are taking PrEP.  Use sunscreen. Apply  sunscreen liberally and repeatedly throughout the day. You should seek shade when your shadow is shorter than you. Protect yourself by wearing long sleeves, pants, a wide-brimmed hat, and sunglasses year round whenever you are outdoors.  Tell your health care provider of new moles or changes in moles, especially if there is a change in shape or color. Also, tell your health care provider if a mole is larger than the size of a pencil eraser.  A one-time screening for abdominal aortic aneurysm (AAA) and surgical repair of large AAAs by ultrasound is recommended for men aged 65-75 years who are current or former smokers.  Stay current with your vaccines (immunizations).   This information is not intended to replace advice given to you by your health care provider. Make sure you discuss any questions you have with your health care provider.   Document Released: 04/19/2008 Document Revised: 11/12/2014 Document Reviewed: 03/19/2011 Elsevier Interactive Patient Education 2016 [employer] Hidradenitis Suppurativa Hidradenitis suppurativa is a long-term (chronic) skin disease that starts with blocked sweat glands or hair follicles. Bacteria may grow in these blocked openings of your skin. Hidradenitis suppurativa is like a severe form of acne that develops in areas of your body where acne would be unusual. It is most likely to affect the areas of your body where skin rubs against skin and becomes moist. This includes your:  Underarms.  Groin.  Genital areas.  Buttocks.  Upper thighs.  Breasts. Hidradenitis suppurativa may start out with small pimples. The pimples can develop into deep sores that break open (rupture) and drain pus. Over time your skin may thicken and become scarred. Hidradenitis suppurativa cannot be passed from person to person.  CAUSES  The exact cause of hidradenitis suppurativa is not known. This condition may be due to:  Male and male hormones. The condition is rare  before and after puberty.  An overactive body defense system (immune system). Your immune system may overreact to the blocked hair follicles or sweat glands and cause swelling and pus-filled sores. RISK FACTORS You may have a higher risk of hidradenitis suppurativa if you:  Are a woman.  Are between ages 56 and 71.  Have a family history of hidradenitis suppurativa.  Have a personal history of acne.  Are overweight.  Smoke.  Take the drug lithium. SIGNS AND SYMPTOMS  The first signs of an outbreak are usually painful skin bumps that look  like pimples. As the condition progresses:  Skin bumps may get bigger and grow deeper into the skin.  Bumps under the skin may rupture and drain smelly pus.  Skin may become itchy and infected.  Skin may thicken and scar.  Drainage may continue through tunnels under the skin (fistulas).  Walking and moving your arms can become painful. DIAGNOSIS  Your health care provider may diagnose hidradenitis suppurativa based on your medical history and your signs and symptoms. A physical exam will also be done. You may need to see a health care provider who specializes in skin diseases (dermatologist). You may also have tests done to confirm the diagnosis. These can include:  Swabbing a sample of pus or drainage from your skin so it can be sent to the lab and tested for infection.  Blood tests to check for infection. TREATMENT  The same treatment will not work for everybody with hidradenitis suppurativa. Your treatment will depend on how severe your symptoms are. You may need to try several treatments to find what works best for you. Part of your treatment may include cleaning and bandaging (dressing) your wounds. You may also have to take medicines, such as the following:  Antibiotics.  Acne medicines.  Medicines to block or suppress the immune system.  A diabetes medicine (metformin) is sometimes used to treat this condition.  For women,  birth control pills can sometimes help relieve symptoms. You may need surgery if you have a severe case of hidradenitis suppurativa that does not respond to medicine. Surgery may involve:   Using a laser to clear the skin and remove hair follicles.  Opening and draining deep sores.  Removing the areas of skin that are diseased and scarred. HOME CARE INSTRUCTIONS  Learn as much as you can about your disease, and work closely with your health care providers.  Take medicines only as directed by your health care provider.  If you were prescribed an antibiotic medicine, finish it all even if you start to feel better.  If you are overweight, losing weight may be very helpful. Try to reach and maintain a healthy weight.  Do not use any tobacco products, including cigarettes, chewing tobacco, or electronic cigarettes. If you need help quitting, ask your health care provider.  Do not shave the areas where you get hidradenitis suppurativa.  Do not wear deodorant.  Wear loose-fitting clothes.  Try not to overheat and get sweaty.  Take a daily bleach bath as directed by your health care provider.  Fill your bathtub halfway with water.  Pour in  cup of unscented household bleach.  Soak for 5-10 minutes.  Cover sore areas with a warm, clean washcloth (compress) for 5-10 minutes. SEEK MEDICAL CARE IF:   You have a flare-up of hidradenitis suppurativa.  You have chills or a fever.  You are having trouble controlling your symptoms at home.   This information is not intended to replace advice given to you by your health care provider. Make sure you discuss any questions you have with your health care provider.   Document Released: 06/05/2004 Document Revised: 11/12/2014 Document Reviewed: 01/22/2014 Elsevier Interactive Patient Education [employer]

## 2016-04-23 NOTE — Progress Notes (Signed)
[name] is a 30 y.o. male presents to office today for annual physical exam examination.  Concerns today include:  Is scheduled to go on an airplane ride in September.  Has old bottle of Ativan from about 1.5 years ago.  Has never flown.  Has a history of anxiety associated with flying/ roller coasters.  Flying to [place] from [place].  Last eye exam: 1 year ago, goes every 2 years.  Wears glasses Last dental exam: q6 months Refills needed today: none  Past Medical History  Diagnosis Date  . Pulmonary artery stenosis, main   . Autism disorder   . Mental retardation   . ASD (atrial septal defect)     a. 11/2012 ASD closure of ostium secundum ASD w/ 16mm Amplatzer septal occluder device;  b. 11/2012 Echo: EF 55-60%, mild MR, Triv PS, ASD closure device in place.  Marland Kitchen History of echocardiogram     Echo 11/16: EF 55-60%, no RWMA, Gr 2 DD, mild MR, s/p ASD closure with no evidence of shunt, trivial TR, mild Pulmonic stenosis, trivial PI   Social History   Social History  . Marital Status: Single    Spouse Name: N/A  . Number of Children: N/A  . Years of Education: N/A   Occupational History  . Not on file.   Social History Main Topics  . Smoking status: Never Smoker   . Smokeless tobacco: Never Used  . Alcohol Use: No  . Drug Use: No  . Sexual Activity: No   Other Topics Concern  . Not on file   Social History Narrative   Past Surgical History  Procedure Laterality Date  . Tonsillectomy    . Tee without cardioversion  10/23/2012    Procedure: TRANSESOPHAGEAL ECHOCARDIOGRAM (TEE);  Surgeon: Laurey Morale, MD;  Location: [facility] ENDOSCOPY;  Service: Cardiovascular;  Laterality: N/A;  Dr. requesting [name]. come over to push propofol/ pt is hard to sedate  . Cardiac surgery    . Transesophageal echocardiogram w/o cardioversion N/A 11/24/2012    Procedure: TRANSESOPHAGEAL ECHOCARDIOGRAM W/O CARDIOVERSION;  Surgeon: Tonny Bollman, MD;  Location: [facility] CATH LAB;  Service:  Cardiovascular;  Laterality: N/A;  . Asd repair N/A 11/24/2012    Procedure: ATRIAL SEPTAL DEFECT (ASD) REPAIR;  Surgeon: Tonny Bollman, MD;  Location: [facility] CATH LAB;  Service: Cardiovascular;  Laterality: N/A;   Family History  Problem Relation Age of Onset  . Heart attack Neg Hx   . Stroke Neg Hx   . Hypertension Mother   . Diabetes Mother     ROS: Review of Systems Constitutional: negative Eyes: positive for contacts/glasses Ears, nose, mouth, throat, and face: negative Respiratory: negative Cardiovascular: negative Gastrointestinal: negative Genitourinary:negative Integument/breast: positive for skin lesion(s) and under armpits Hematologic/lymphatic: negative Musculoskeletal:negative Neurological: negative Behavioral/Psych: positive for aggressive behavior, anxiety and bad mood Endocrine: negative Allergic/Immunologic: negative   Physical exam BP 122/75 mmHg  Pulse 73  Temp(Src) 97.8 F (36.6 C) (Oral)  Ht  (1.854 m)  Wt 208 lb 6.4 oz (94.53 kg)  BMI 27.50 kg/m2 General appearance: alert, cooperative, appears stated age and no distress Head: Normocephalic, without obvious abnormality, atraumatic Eyes: negative findings: lids and lashes normal, conjunctivae and sclerae normal and pupils equal, round, reactive to light and accomodation Ears: normal TM's and external ear canals both ears Nose: Nares normal. Septum midline. Mucosa normal. No drainage or sinus tenderness. Throat: lips, mucosa, and tongue normal; teeth and gums normal Neck: no adenopathy, supple, symmetrical, trachea midline and thyroid  not enlarged, symmetric, no tenderness/mass/nodules Back: symmetric, no curvature. ROM normal. No CVA tenderness. Lungs: clear to auscultation bilaterally Chest wall: no tenderness Heart: regular rate and rhythm, S1, S2 normal, no murmur, click, rub or gallop Abdomen: soft, non-tender; bowel sounds normal; no masses,  no organomegaly Extremities: extremities normal,  atraumatic, no cyanosis or edema Pulses: 2+ and symmetric Skin: bilateral axilla with scarring.  Right axilla with draining abscess. non tender to palpation Lymph nodes: Cervical, supraclavicular, and axillary nodes normal. Neurologic: Alert and oriented X 3, normal strength and tone. Normal symmetric reflexes. Normal coordination and gait    Assessment/ Plan: Patient with has AUTISM; GLAUCOMA; STRABISMUS, MECHANICAL D/T MUSCLE DISORDER; Eczema; Ebstein's anomaly; Pulmonic stenosis, congenital; Chromosome abnormality; Allergic rhinitis; ASD (atrial septal defect); Left ventricular noncompaction (HCC); Pulmonary artery aneurysm (HCC); Secondary anxiety disorder, other type; Seasonal allergies; and URI (upper respiratory infection) on his problem list. here for annual physical exam.   1. Encounter for annual physical exam - PMH, PSHx, SocHx, FamHx updated/ reviewed - Med list updated. - COMPLETE METABOLIC PANEL WITH GFR; Future  2. Screening for HIV (human immunodeficiency virus).  Unfortunately lab closed - To plan to obtain at future date - HIV antibody; Future  3. Secondary anxiety disorder, other type - LORazepam (ATIVAN) 0.5 MG tablet; Take 1 tablet (0.5mg ) 30 minutes before boarding plane.  May repeat dose ONCE if needed after 1 hour of flying.  Dispense: 10 tablet; Refill: 0 - Instructions reviewed  4. Hidradenitis axillaris.  Right sided lesion actively draining.  No systemic symptoms - Could consider oral abx if no improvement with topical. - Skin care reviewed, see AVS - Benzoyl Peroxide (BENZOYL PEROXIDE) 10 % LIQD; Wash under arms once daily.  Dispense: 227 g; Refill: 5  Jobeth Pangilinan M. Nadine Counts, DO PGY-2, [facility]

## 2016-08-06 ENCOUNTER — Other Ambulatory Visit: Payer: Self-pay | Admitting: Family Medicine

## 2016-09-10 ENCOUNTER — Other Ambulatory Visit: Payer: Self-pay | Admitting: Family Medicine

## 2016-09-10 DIAGNOSIS — L219 Seborrheic dermatitis, unspecified: Secondary | ICD-10-CM

## 2016-09-11 ENCOUNTER — Ambulatory Visit (INDEPENDENT_AMBULATORY_CARE_PROVIDER_SITE_OTHER): Payer: Medicaid Other | Admitting: *Deleted

## 2016-09-11 DIAGNOSIS — Z23 Encounter for immunization: Secondary | ICD-10-CM

## 2016-09-12 ENCOUNTER — Other Ambulatory Visit: Payer: Self-pay | Admitting: Family Medicine

## 2016-09-18 ENCOUNTER — Telehealth: Payer: Self-pay | Admitting: Physician Assistant

## 2016-09-19 ENCOUNTER — Ambulatory Visit (HOSPITAL_COMMUNITY): Payer: Medicaid Other | Attending: Cardiovascular Disease

## 2016-09-19 ENCOUNTER — Ambulatory Visit (HOSPITAL_COMMUNITY): Admission: RE | Admit: 2016-09-19 | Payer: Medicaid Other | Source: Ambulatory Visit

## 2016-09-19 ENCOUNTER — Other Ambulatory Visit: Payer: Self-pay

## 2016-09-19 DIAGNOSIS — Q211 Atrial septal defect, unspecified: Secondary | ICD-10-CM

## 2016-09-19 DIAGNOSIS — I428 Other cardiomyopathies: Secondary | ICD-10-CM | POA: Insufficient documentation

## 2016-09-19 DIAGNOSIS — R079 Chest pain, unspecified: Secondary | ICD-10-CM | POA: Diagnosis present

## 2016-09-19 DIAGNOSIS — I370 Nonrheumatic pulmonary valve stenosis: Secondary | ICD-10-CM | POA: Diagnosis not present

## 2016-09-19 DIAGNOSIS — Z8774 Personal history of (corrected) congenital malformations of heart and circulatory system: Secondary | ICD-10-CM | POA: Diagnosis not present

## 2016-09-20 ENCOUNTER — Encounter: Payer: Self-pay | Admitting: Physician Assistant

## 2016-10-08 ENCOUNTER — Other Ambulatory Visit: Payer: Self-pay | Admitting: Family Medicine

## 2016-10-08 DIAGNOSIS — L219 Seborrheic dermatitis, unspecified: Secondary | ICD-10-CM

## 2016-10-10 ENCOUNTER — Encounter (HOSPITAL_COMMUNITY): Payer: Self-pay | Admitting: *Deleted

## 2016-10-10 ENCOUNTER — Telehealth (HOSPITAL_COMMUNITY): Payer: Self-pay | Admitting: *Deleted

## 2016-10-10 NOTE — Telephone Encounter (Signed)
Pt's mom called stating she needs a letter from Dr Shirlee Latch stating pt's heart condition and echo results.  Letter completed and signed by Dr Shirlee Latch.  Left pt's mom a VM this was completed and ready for p/u at front desk

## 2016-11-14 ENCOUNTER — Encounter (HOSPITAL_COMMUNITY): Payer: Self-pay

## 2016-11-14 ENCOUNTER — Ambulatory Visit (HOSPITAL_COMMUNITY)
Admission: RE | Admit: 2016-11-14 | Discharge: 2016-11-14 | Disposition: A | Discharge status: Home / Self Care | Payer: Medicaid Other | Source: Ambulatory Visit | Attending: Cardiology | Admitting: Cardiology

## 2016-11-14 VITALS — BP 125/78 | HR 75 | Wt 231.5 lb

## 2016-11-14 DIAGNOSIS — I370 Nonrheumatic pulmonary valve stenosis: Secondary | ICD-10-CM | POA: Insufficient documentation

## 2016-11-14 DIAGNOSIS — F71 Moderate intellectual disabilities: Secondary | ICD-10-CM | POA: Diagnosis not present

## 2016-11-14 DIAGNOSIS — Z9889 Other specified postprocedural states: Secondary | ICD-10-CM | POA: Diagnosis not present

## 2016-11-14 DIAGNOSIS — Z7951 Long term (current) use of inhaled steroids: Secondary | ICD-10-CM | POA: Insufficient documentation

## 2016-11-14 DIAGNOSIS — Q9389 Other deletions from the autosomes: Secondary | ICD-10-CM | POA: Diagnosis not present

## 2016-11-14 DIAGNOSIS — Q211 Atrial septal defect, unspecified: Secondary | ICD-10-CM

## 2016-11-14 DIAGNOSIS — F840 Autistic disorder: Secondary | ICD-10-CM | POA: Insufficient documentation

## 2016-11-14 DIAGNOSIS — Z79899 Other long term (current) drug therapy: Secondary | ICD-10-CM | POA: Diagnosis not present

## 2016-11-14 DIAGNOSIS — J309 Allergic rhinitis, unspecified: Secondary | ICD-10-CM | POA: Diagnosis not present

## 2016-11-14 DIAGNOSIS — I281 Aneurysm of pulmonary artery: Secondary | ICD-10-CM | POA: Insufficient documentation

## 2016-11-14 NOTE — Progress Notes (Signed)
Patient ID: [name], male   DOB: 02-Jun-1986, 31 y.o.   MRN: [id number] PCP: Dr. Nadine Counts Cardiology: Dr. 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  Patient was followed in the past by a pediatric cardiologist then by Dr. Reyes Ivan at [facility].  He had not had cardiology followup for several years prior to his initial appointment with me.  Echo in 2005 showed normal LV EF but questioned noncompaction towards the apex, mild pulmonic stenosis, small secundum ASD, and normal tricuspid valve.  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  In 10/13,  I had him do an echo (difficult study).  LV EF was normal, there appeared to be mild PS with mean gradient 18 mmHg, the main PA was moderately dilated.  The atrial septum and RV were not well-visualized.  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  Given the ASD and RV dilation, TEE was done.  This confirmed at 1.5 cm secundum ASD with left to right flow and RV dilation.  He had percutaneous ASD closure in 1/14 due to enlarge RV.  This was successful.  Cath prior to closure showed Qp/Qs 1.9/1.  Echo after procedure in 2/14 showed EF 50-55%, mild MR, intact ASD closure device, mild PS with peak gradient 17 mmHg.  MRA chest in 11/16 showed stable 4.6 cm main PA.  Most recent echo in 11/17 showed EF 55-60% with prominent apical trabeculations, normal RV, ASD closure device well-seated, and PV gradient peak 9 mmHg.   Patient is relatively high-functioning for autism.  He reads and writes and has a relatively good memory per his mother.  He has trouble with social interactions.  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  No exertional dyspnea or chest pain.  He participates in the [employer] and skates, bowls, swims, and plays basketball.  He has even been doing some downhill skiing.  No history of syncope or lightheadedness.   ECG: NSR, inferior T wave inversions  PMH: 1. Chromosome 8p deletion syndrome: Autism + cardiac anomalies.  He has pulmonic stenosis and a secundum ASD.  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  He is followed by a geneticist at [facility].  2. Congenital heart disease: Echo in 2005 showed EF 55-60% with ? 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  Echo (10/13) was a difficult study with EF 60-65%, mild MR, mild PS peak gradient 18 mmHg, dilated main PA.  Cardiac MRI (10/13) with LV EF 55%, prominent trabeculations LV apex suggestive of noncompaction, minimal PI but suspect mild PS, moderate RV dilation with EF 45% (RV EDVi 155 ml/m2), main PA dilated to 4.6 x 4.4 cm, secundum ASD, hard to tell size on MRI, Qp/Qs by MRI was 1.4/1.  TEE (12/13): EF 55%, LV noncompaction, RV mild-moderately dilated with normal systolic function, 1.5 cm secundum ASD with left to right flow, no significant PI, unable to measure  PV gradient but does not look like significant PS.  Echo after ASD closure in (2/14) with  EF 50-55%, mild MR, intact ASD closure device, mild PS with peak gradient 17 mmHg. Summary: Mild PS.  Secundum ASD with dilated RV.  ASD was closed percutaneously on 1/14.  Qp/Qs on cath prior to procedure was 1.9/1.  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  MRA chest in 10/14 showed stable 4.6 cm main PA dilation.  - Echo (11/17) with EF 55-60% with prominent apical trabeculations, normal RV size and systolic function, well-seated ASD closure device  with no evident leak, h/o PS with peak gradient 9 mmHg.  - MRA chest (11/16) with 4.5-4.6 cm main PA.  3. Autism with moderate mental retardation.  4. Allergic rhinitis  SH: Living in group home and participating in a day program.  Mother very involved with his care.  Nonsmoker, no drugs.  FH: Mother with heart murmur.   ROS: All systems reviewed and negative except as per HPI.   Current Outpatient Prescriptions  Medication Sig Dispense Refill  . Benzoyl Peroxide (BENZOYL PEROXIDE) 10 % LIQD Wash under arms once daily. 227 g 5  . cetirizine (ZYRTEC) 10 MG tablet TAKE (1) TABLET BY MOUTH ONCE DAILY. 30 tablet 12  . fluticasone (FLONASE) 50 MCG/ACT nasal spray USE 2 SPRAYS IN EACH NOSTRIL ONCE DAILY. 16 g 11  . ketoconazole (NIZORAL) 2 % shampoo APPLY 1 APPLICATION TOPICALLY TWO TIMES A WEEK. 120 mL 11  . montelukast (SINGULAIR) 10 MG tablet TAKE 1 TABLET BY MOUTH AT BEDTIME. 30 tablet 12  . LORazepam (ATIVAN) 0.5 MG tablet Take 1 tablet (0.5mg ) 30 minutes before boarding plane.  May repeat dose ONCE if needed after 1 hour of flying. (Patient not taking: Reported on 11/14/2016) 10 tablet 0   No current facility-administered medications for this encounter.     BP 125/78   Pulse 75   Wt 231 lb 8 oz (105 kg)   SpO2 99%   BMI 30.54 kg/m  General: NAD Neck: No JVD, no thyromegaly or thyroid nodule.  Lungs: Clear to auscultation bilaterally with normal respiratory effort. CV: Nondisplaced PMI.  Heart regular S1/S2, no S3/S4, fixed split S2, 1/6 early SEM LUSB.  No peripheral edema.  No carotid bruit.  Normal pedal pulses.  Abdomen: Soft, nontender, no hepatosplenomegaly, no distention.  Skin: Intact without lesions or rashes.  Neurologic: Alert and oriented x 3.  Psych: Normal affect. Extremities: No clubbing or cyanosis.   Assessment/Plan: 1. Secundum ASD: Given enlarged RV, this was closed percutaneously in 1/14.  Last echo in 11/17 showed ASD closure device in place with no visible  residual shunt.  Continue ASA 81 long-term.   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  LV EF is preserved.   3. 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  This can be seen with pulmonic stenosis (appears to be mild in this patient).  High flow from prior ASD may be a major contributor as well.  It is possible that with the ASD closed, the PA will not enlarge further or potentially even decrease in size given decrease in pulmonary flow.  Will repeat MRA chest to follow the PA.   4. Pulmonic stenosis: Mild by echo.  No significant pulmonic insufficiency was visualized.  Can get echo again in 2 years (11/19).   Marca Ancona 03/27/2014

## 2016-11-14 NOTE — Patient Instructions (Signed)
Your provider requests you have an MRA of the chest.  Follow up with Dr.McLean in 1 year.

## 2016-12-14 ENCOUNTER — Other Ambulatory Visit: Payer: Self-pay | Admitting: Family Medicine

## 2016-12-14 DIAGNOSIS — L732 Hidradenitis suppurativa: Secondary | ICD-10-CM

## 2017-02-06 ENCOUNTER — Encounter: Payer: Self-pay | Admitting: Internal Medicine

## 2017-02-06 ENCOUNTER — Ambulatory Visit (INDEPENDENT_AMBULATORY_CARE_PROVIDER_SITE_OTHER): Payer: Medicaid Other | Admitting: Internal Medicine

## 2017-02-06 VITALS — BP 99/70 | HR 77 | Temp 98.4°F | Wt 222.0 lb

## 2017-02-06 DIAGNOSIS — J302 Other seasonal allergic rhinitis: Secondary | ICD-10-CM | POA: Diagnosis not present

## 2017-02-06 DIAGNOSIS — H6123 Impacted cerumen, bilateral: Secondary | ICD-10-CM | POA: Diagnosis not present

## 2017-02-06 MED ORDER — GUAIFENESIN ER 600 MG PO TB12: 600.0000 mg | ORAL_TABLET | Freq: Two times a day (BID) | ORAL | 3 refills | Status: DC | PRN

## 2017-02-06 MED ORDER — CARBAMIDE PEROXIDE 6.5 % OT SOLN: 5.0000 [drp] | Freq: Two times a day (BID) | OTIC | 0 refills | Status: DC | PRN

## 2017-02-06 NOTE — Patient Instructions (Signed)
Please take mucinex as needed for cough and congestion. Can use Debrox as needed to clear out ear wax

## 2017-02-06 NOTE — Assessment & Plan Note (Addendum)
Provide patient with Mucinex  Continue zyrtec, flonase and singular  Follow up as needed

## 2017-02-06 NOTE — Progress Notes (Signed)
   [facility] Noralee Chars, MD Phone: [phone]  Reason For Visit: SDA for Allergies   Patient hx of autism, ebstein's anomaly presenting for allergy exacerbation   - Cough and congestion during the month of March  - No fevers or chills  - Hx of seasonal allergies  - Currently Flonase, singular and zyrtec  - Denies any complaints of facial pain  - Denies any hx of headaches - Mother states patient usually takes Mucinex to help with allergy exacerbation    Past Medical History Reviewed problem list.  Medications- reviewed and updated No additions to family history Social history- patient is a non- smoker  Objective: BP 99/70   Pulse 77   Temp 98.4 F (36.9 C) (Oral)   Wt 222 lb (100.7 kg)   SpO2 97%   BMI 29.29 kg/m  Gen: NAD, alert, cooperative with exam HEENT: Normal    Neck: No masses palpated. No lymphadenopathy    Ears: Tympanic membranes intact with wax noted in left ear, normal light reflex, no erythema, no bulging    Nose: nasal turbinates noted with congestion     Throat: post-nasal drip noted Cardio: regular rate and rhythm, S1S2 heard, systolic murmur appreciated Pulm: clear to auscultation bilaterally, no wheezes, rhonchi or rales Skin: dry, intact, no rashes or lesions   Assessment/Plan: See problem based a/p  Allergic rhinitis Provide patient with Mucinex  Continue zyrtec, flonase and singular  Follow up as needed

## 2017-02-07 ENCOUNTER — Telehealth: Payer: Self-pay | Admitting: *Deleted

## 2017-02-07 ENCOUNTER — Ambulatory Visit (INDEPENDENT_AMBULATORY_CARE_PROVIDER_SITE_OTHER): Payer: Medicaid Other | Admitting: Family Medicine

## 2017-02-07 ENCOUNTER — Encounter: Payer: Self-pay | Admitting: Family Medicine

## 2017-02-07 VITALS — BP 102/76 | HR 75 | Temp 97.9°F | Ht 73.0 in | Wt 223.2 lb

## 2017-02-07 DIAGNOSIS — H6122 Impacted cerumen, left ear: Secondary | ICD-10-CM | POA: Diagnosis present

## 2017-02-07 NOTE — Progress Notes (Signed)
   Subjective: CC: decreased hearing ZCH:[name] is a 31 y.o. male presenting to clinic today for same day appointment. PCP: Delynn Flavin, DO Concerns today include:  1. Decreased hearing Patient reports that he has had decreased hearing on the left side for a while.  He was seen in clinic by Dr Cathlean Cower yesterday for allergic symptoms.  He denies ear pain, drainage, headache, fevers.  He would like [place] to try and get the wax out.  Allergies  Allergen Reactions  . Sulfa Antibiotics Other (See Comments)    Violent gastric bleeding    Social Hx reviewed. MedHx, current medications and allergies reviewed.  Please see EMR. ROS: Per HPI  Objective: Office vital signs reviewed. BP 102/76   Pulse 75   Temp 97.9 F (36.6 C) (Oral)   Ht 6\' 1"  (1.854 m)   Wt 223 lb 3.2 oz (101.2 kg)   SpO2 97%   BMI 29.45 kg/m   Physical Examination:  General: Awake, alert, well nourished, syndromic appearance, No acute distress HEENT: Normal    Neck: No masses palpated. No lymphadenopathy    Ears: Left TM obstructed by dark cerumen, Right TM intact w/ normal light reflex, no erythema, no bulging  Left TM well visualized after irrigation of left ear.  TM in tact, no bulging or erythema.  No purulence behind TM.    Nose: nasal turbinates moist, no nasal discharge    Throat: moist mucus membranes Cardio: regular rate and rhythm, S1S2 heard, no murmurs appreciated Pulm: normal work of breathing on room air  Assessment/ Plan: 31 y.o. male   1. Impacted cerumen of left ear.  Responded well to irrigation.  Large cerumen ball removed.  Patient's hearing improved after this. - Counseled on avoidance of q-tips - Home care instructions reviewed.  See AVS - Follow up prn   Raliegh Ip, DO PGY-3, [facility]

## 2017-02-07 NOTE — Patient Instructions (Signed)
Earwax Buildup Your ears make a substance called earwax. It may also be called cerumen. Sometimes, too much earwax builds up in your ear canal. This can cause ear pain and make it harder for you to hear. CAUSES This condition is caused by too much earwax production or buildup. RISK FACTORS The following factors may make you more likely to develop this condition:  Cleaning your ears often with swabs.  Having narrow ear canals.  Having earwax that is overly thick or sticky.  Having eczema.  Being dehydrated. SYMPTOMS Symptoms of this condition include:  Reduced hearing.  Ear drainage.  Ear pain.  Ear itch.  A feeling of fullness in the ear or feeling that the ear is plugged.  Ringing in the ear.  Coughing. DIAGNOSIS Your health care provider can diagnose this condition based on your symptoms and medical history. Your health care provider will also do an ear exam to look inside your ear with a scope (otoscope). You may also have a hearing test. TREATMENT Treatment for this condition includes:  Over-the-counter or prescription ear drops to soften the earwax.  Earwax removal by a health care provider. This may be done:  By flushing the ear with body-temperature water.  With a medical instrument that has a loop at the end (earwax curette).  With a suction device. HOME CARE INSTRUCTIONS  Take over-the-counter and prescription medicines only as told by your health care provider.  Do not put any objects, including an ear swab, into your ear. You can clean the opening of your ear canal with a washcloth.  Drink enough water to keep your urine clear or pale yellow.  If you have frequent earwax buildup or you use hearing aids, consider seeing your health care provider every 6-12 months for routine preventive ear cleanings. Keep all follow-up visits as told by your health care provider. SEEK MEDICAL CARE IF:  You have ear pain.  Your condition does not improve with  treatment.  You have hearing loss.  You have blood, pus, or other fluid coming from your ear. This information is not intended to replace advice given to you by your health care provider. Make sure you discuss any questions you have with your health care provider. Document Released: 11/29/2004 Document Revised: 02/13/2016 Document Reviewed: 06/08/2015 Elsevier Interactive Patient Education  2017 Elsevier Inc.  

## 2017-02-07 NOTE — Telephone Encounter (Signed)
Contacted pt mom who was calling to voice her frustration over the fact that her son came in yesterday for sinus issues and that he could not hear,  his ears were looked at and the doctor did tell her that there was wax in the left ear.  Mom said she told the doctor that at least once a year her son has to have his ears washed out.  She said that the doctor took an ear curette and did get a little out and said that the doctor said she got it all.  Mom said that she did use the debrox and that her son was miserable last night.  She was frustrated and felt that we had dropped the ball because it had clearly not been cleaned out completely at yesterdays visit.  Routing to doctor who saw pt yesterday and PCP.

## 2017-02-08 NOTE — Telephone Encounter (Signed)
Attempted to call mother regarding concerns.  Unfortunately, call went straight to voicemail.  I left VM encouraging her to call the office back on Monday if she'd like to further discuss her concerns with me.

## 2017-02-11 ENCOUNTER — Telehealth: Payer: Self-pay | Admitting: Family Medicine

## 2017-02-11 NOTE — Telephone Encounter (Signed)
Mother is returning the doctors call. jw

## 2017-02-11 NOTE — Telephone Encounter (Signed)
Returned phone call.  Sent straight to VM again.  Encouraged [name] to call me back at the office if she has further questions/ concerns.

## 2017-02-11 NOTE — Telephone Encounter (Signed)
Mother is returning the doctor's call about her son. Please call today. jw

## 2017-02-12 NOTE — Telephone Encounter (Signed)
I was able to reach [name] regarding her concerns.  [name] is doing much better s/p irrigation of ear and on mucinex provided by Dr Cathlean Cower.

## 2017-02-12 NOTE — Telephone Encounter (Signed)
Mom is returning your call. ep

## 2017-05-15 ENCOUNTER — Ambulatory Visit (INDEPENDENT_AMBULATORY_CARE_PROVIDER_SITE_OTHER): Payer: Medicaid Other | Admitting: Family Medicine

## 2017-05-15 ENCOUNTER — Encounter: Payer: Self-pay | Admitting: Family Medicine

## 2017-05-15 VITALS — BP 112/74 | HR 73 | Temp 97.6°F | Ht 73.0 in | Wt 205.6 lb

## 2017-05-15 DIAGNOSIS — Z0289 Encounter for other administrative examinations: Secondary | ICD-10-CM

## 2017-05-15 NOTE — Progress Notes (Signed)
   Subjective:    Patient ID: EUGINE GODMAN , male   DOB: Aug 01, 1986 , 31 y.o..   MRN: [id number]  HPI  VERNE CHAUSSEE is a 31 yo male with autism, ebstein's anomaly here for  Chief Complaint  Patient presents with  . office visit    1. Need to fill out form: Gerber is here with his friend today. [place] an [place]. No other concerns  Review of Systems: Per HPI.   Past Medical History: Patient Active Problem List   Diagnosis Date Noted  . URI (upper respiratory infection) 12/08/2015  . Seasonal allergies 09/09/2015  . Secondary anxiety disorder, other type 04/26/2014  . 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 ASD (atrial septal defect) 08/20/2012  . Allergic rhinitis 08/18/2012  . 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 AUTISM 07/15/2009  . 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 GLAUCOMA 01/02/2007  . Eczema 01/02/2007    Medications: reviewed and updated Current Outpatient Prescriptions  Medication Sig Dispense Refill  . BENZOYL PEROXIDE 10 % external wash WASH UNDER ARMS ONCE DAILY 142 g 11  . carbamide peroxide (DEBROX) 6.5 % otic solution Place 5 drops into both ears 2 (two) times daily as needed. 15 mL 0  . cetirizine (ZYRTEC) 10 MG tablet TAKE (1) TABLET BY MOUTH ONCE DAILY. 30 tablet 12  . fluticasone (FLONASE) 50 MCG/ACT nasal spray USE 2 SPRAYS IN EACH NOSTRIL ONCE DAILY. 16 g 11  . guaiFENesin (MUCINEX) 600 MG 12 hr tablet Take 1 tablet (600 mg total) by mouth 2 (two) times daily as needed for cough or to loosen phlegm. 30 tablet 3  . ketoconazole (NIZORAL) 2 % shampoo APPLY 1 APPLICATION TOPICALLY TWO TIMES A WEEK. 120 mL 11  . LORazepam (ATIVAN) 0.5 MG tablet Take 1 tablet (0.5mg ) 30 minutes before boarding plane.  May repeat dose ONCE if needed after 1 hour of flying. (Patient not taking:  Reported on 11/14/2016) 10 tablet 0  . montelukast (SINGULAIR) 10 MG tablet TAKE 1 TABLET BY MOUTH AT BEDTIME. 30 tablet 12   No current facility-administered medications for this visit.     Social Hx:  reports that he has never smoked. He has never used smokeless tobacco.   Objective:   BP 112/74   Pulse 73   Temp 97.6 F (36.4 C) (Oral)   Ht 6\' 1"  (1.854 m)   Wt 205 lb 9.6 oz (93.3 kg)   SpO2 96%   BMI 27.13 kg/m  Physical Exam  Gen: NAD, alert, cooperative with exam, well-appearing  Assessment & Plan:   Encounter for form: [place] form filled out today. No other concerns. Follow up as needed  Anders Simmonds, MD [facility], PGY-3

## 2017-07-11 NOTE — Progress Notes (Signed)
   Subjective:    Patient ID: [name] , male   DOB: [birth year] , 31 y.o..   MRN: [id number]  HPI  [name] is 31 yo male with autism, ebstein's anomaly here for  Chief Complaint  Patient presents with  . Allergies    stuffy nose x 1 week    1. ALLERGIES Has had symptom for 7 days. Seasonal symptoms: seems to happen year round off and on  Nasal discharge: yes Medications tried: Takes Currently Flonase, singular and zyrtec   Symptoms Sneezing: yes Scratchy throat: no Fever: no Headache or face pain: no Tooth pain: no Sore throat or swollen glands: no Severe fatigue: no Stiff neck: no Shortness of breath: no Rash: no  ROS see HPI Smoking Status noted  Past Medical History: Patient Active Problem List   Diagnosis Date Noted  . URI (upper respiratory infection) 12/08/2015  . Seasonal allergies 09/09/2015  . Secondary anxiety disorder, other type 04/26/2014  . 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 ASD (atrial septal defect) 08/20/2012  . Allergic rhinitis 08/18/2012  . 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 AUTISM 07/15/2009  . 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 GLAUCOMA 01/02/2007  . Eczema 01/02/2007    Medications: reviewed   Social Hx:  reports that he has never smoked. He has never used smokeless tobacco.   Objective:   BP 122/80 (BP Location: Right Arm, Patient Position: Sitting, Cuff Size: Normal)   Pulse 79   Temp 98.6 F (37 C) (Oral)   Ht 6\' 1"  (1.854 m)   Wt 206 lb 3.2 oz (93.5 kg)   SpO2 98%   BMI 27.20 kg/m  Physical Exam  Gen: NAD, alert, cooperative with exam, well-appearing HEENT:     Head: Normocephalic, atraumatic    Neck: No masses palpated. No goiter. No lymphadenopathy     Ears: External ears normal, no drainage.Tympanic membranes intact, normal light reflex  bilaterally, no erythema or bulging    Eyes: PERRLA, EOMI, sclera white, normal conjunctiva, watery eyes    Nose: nasal turbinates moist but inflamed, narrow nasal passage, slight nasal discharge    Throat: moist mucus membranes, no pharyngeal erythema, no tonsillar exudate. Airway is patent  Assessment & Plan:  Seasonal allergies Symptoms uncontrolled even on 10 mg Zyrtec, 10 mg Singulair, and Flonase daily. No signs of infection including sinusitis, pharyngitis, otitis media. -Continue Zyrtec, Singulair, Flonase -Afrin 3 days - flu shot today -Refer to allergy specialist at this time given chronicity and severity of symptoms despite medications  Orders Placed This Encounter  Procedures  . Flu Vaccine QUAD 36+ mos IM  . Ambulatory referral to Allergy    Referral Priority:   Routine    Referral Type:   Allergy Testing    Referral Reason:   Specialty Services Required    Requested Specialty:   Allergy    Number of Visits Requested:   1   Meds ordered this encounter  Medications  . oxymetazoline (AFRIN NASAL SPRAY) 0.05 % nasal spray    Sig: Place 1 spray into both nostrils 2 (two) times daily.    Dispense:  30 mL    Refill:  0    Anders Simmonds, MD [facility], PGY-3

## 2017-07-12 ENCOUNTER — Encounter: Payer: Self-pay | Admitting: Family Medicine

## 2017-07-12 ENCOUNTER — Ambulatory Visit (INDEPENDENT_AMBULATORY_CARE_PROVIDER_SITE_OTHER): Payer: Medicaid Other | Admitting: Family Medicine

## 2017-07-12 VITALS — BP 122/80 | HR 79 | Temp 98.6°F | Ht 73.0 in | Wt 206.2 lb

## 2017-07-12 DIAGNOSIS — J302 Other seasonal allergic rhinitis: Secondary | ICD-10-CM | POA: Diagnosis not present

## 2017-07-12 DIAGNOSIS — Z889 Allergy status to unspecified drugs, medicaments and biological substances status: Secondary | ICD-10-CM | POA: Diagnosis not present

## 2017-07-12 DIAGNOSIS — Z23 Encounter for immunization: Secondary | ICD-10-CM

## 2017-07-12 MED ORDER — OXYMETAZOLINE HCL 0.05 % NA SOLN: 1.0000 | Freq: Two times a day (BID) | NASAL | 0 refills | Status: AC

## 2017-07-12 NOTE — Patient Instructions (Addendum)
Thank you for coming in today, it was so nice to see you! Today we talked about:    Allergies and sinus problems: Use the afrin nasal spray in the morning and at night x 3 days. Then after that use the flonase as prescribed. Continue zyrtec and Singulair as prescribed. I have placed a referral for an allergy specialist, someone will call you guys within the next 10 days.   If you have any questions or concerns, please do not hesitate to call the office at [phone]. You can also message me directly via MyChart.   Sincerely,  Anders Simmonds, MD

## 2017-07-12 NOTE — Assessment & Plan Note (Addendum)
Symptoms uncontrolled even on 10 mg Zyrtec, 10 mg Singulair, and Flonase daily. No signs of infection including sinusitis, pharyngitis, otitis media. -Continue Zyrtec, Singulair, Flonase -Afrin 3 days - flu shot today -Refer to allergy specialist at this time given chronicity and severity of symptoms despite medications

## 2017-07-30 ENCOUNTER — Telehealth: Payer: Self-pay | Admitting: Family Medicine

## 2017-07-30 NOTE — Telephone Encounter (Signed)
Clinical info completed on [employer] form.  Place form in Dr. Pennie Rushing box for completion.  Marcella Charlson, Maryjo Rochester, CMA

## 2017-07-30 NOTE — Telephone Encounter (Signed)
[employer] o[employer]  form dropped off for at front desk for completion.  Verified that patient section of form has been completed.  Last DOS 07/12/17 [name]  04/23/16 with PCP .  Placed form in white team folder to be completed by clinical staff.  Mom stated today is deadline, if anyway possible please complete today. Mom was told we do ask for 7 days to process & complete forms [name]

## 2017-07-31 NOTE — Telephone Encounter (Signed)
Reviewed, completed, and signed form.  Note routed to RN team inbasket and placed completed form in Clinic RN's office (wall pocket above desk).  Christina M Gambino, MD   

## 2017-08-01 NOTE — Telephone Encounter (Signed)
Mom notified that form is ready for pick up in the front office. Kinnie Feil, RN, BSN

## 2017-08-09 ENCOUNTER — Other Ambulatory Visit: Payer: Self-pay | Admitting: Family Medicine

## 2017-08-09 ENCOUNTER — Ambulatory Visit: Payer: Self-pay | Admitting: Allergy & Immunology

## 2017-08-12 ENCOUNTER — Other Ambulatory Visit: Payer: Self-pay | Admitting: Family Medicine

## 2017-08-16 ENCOUNTER — Ambulatory Visit: Payer: Self-pay | Admitting: Allergy & Immunology

## 2017-09-03 ENCOUNTER — Other Ambulatory Visit: Payer: Self-pay | Admitting: Family Medicine

## 2017-09-06 ENCOUNTER — Ambulatory Visit: Payer: Self-pay | Admitting: Allergy & Immunology

## 2017-09-06 ENCOUNTER — Ambulatory Visit (INDEPENDENT_AMBULATORY_CARE_PROVIDER_SITE_OTHER): Payer: Medicaid Other | Admitting: Allergy & Immunology

## 2017-09-06 ENCOUNTER — Encounter: Payer: Self-pay | Admitting: Allergy & Immunology

## 2017-09-06 VITALS — BP 100/70 | HR 73 | Resp 16 | Ht 71.0 in | Wt 204.0 lb

## 2017-09-06 DIAGNOSIS — J310 Chronic rhinitis: Secondary | ICD-10-CM

## 2017-09-06 DIAGNOSIS — L2084 Intrinsic (allergic) eczema: Secondary | ICD-10-CM | POA: Diagnosis not present

## 2017-09-06 DIAGNOSIS — L219 Seborrheic dermatitis, unspecified: Secondary | ICD-10-CM

## 2017-09-06 MED ORDER — LEVOCETIRIZINE DIHYDROCHLORIDE 5 MG PO TABS: 5.0000 mg | ORAL_TABLET | Freq: Every evening | ORAL | 5 refills | Status: DC

## 2017-09-06 MED ORDER — AZELASTINE HCL 0.1 % NA SOLN: NASAL | 5 refills | Status: DC

## 2017-09-06 MED ORDER — CRISABOROLE 2 % EX OINT: 1.0000 "application " | TOPICAL_OINTMENT | Freq: Two times a day (BID) | CUTANEOUS | 5 refills | Status: DC | PRN

## 2017-09-06 NOTE — Progress Notes (Signed)
Faxed referral to [facility] They will contact the patient to set up an appointment

## 2017-09-06 NOTE — Patient Instructions (Addendum)
1. Chronic rhinitis - We cannot do testing since he took cetirizine last night. - We will do skin testing in 1-2 weeks (he needs to be off of his Zyrtec starting Monday November 5th). - Stop taking: Zyrtec (cetirizine) - Continue with: Singulair (montelukast) 10mg  daily and Flonase (fluticasone) two sprays per nostril daily - Start taking: Xyzal (levocetirizine) 5mg  tablet once daily and Astelin (azelastine) 2 sprays per nostril 1-2 times daily as needed - You can use an extra dose of the antihistamine, if needed, for breakthrough symptoms.  - Consider nasal saline rinses 1-2 times daily to remove allergens from the nasal cavities as well as help with mucous clearance (this is especially helpful to do before the nasal sprays are given) - We will consider allergy shots.   2. Intrinsic atopic dermatitis with seborrheic dermatitis - We will talk to your PCP and get a referral for Dermatology put in.  - Start Eucrisa one application twice daily as needed. - Continue with the moisturizing regimen.   3. Return in about 1 week (around 09/13/2017).   Please inform us of any Emergency Department visits, hospitalizations, or changes in symptoms. Call us before going to the ED for breathing or allergy symptoms since we might be able to fit you in for a sick visit. Feel free to contact us anytime with any questions, problems, or concerns.  It was a pleasure to meet you and your family today! Enjoy the fall season!  Websites that have reliable patient information: 1. American Academy of Asthma, Allergy, and Immunology: www.aaaai.org 2. Food Allergy Research and Education (FARE): foodallergy.org 3. Mothers of Asthmatics: http://www.asthmacommunitynetwork.org 4. American College of Allergy, Asthma, and Immunology: www.acaai.org   Election Day is coming up on Tuesday, November 6th! Although it is too late to register to vote by mail, you can still register up to November 5th at any of the early voting  locations. Try to early vote in case there are problems with your registration!   If you are turned away at the polls, you have the right to request a provisional ballot, which is required by law!      [facility] (open 8am - 5pm) First Floor [address], [place]   [place] (open 8am - 5pm) [address] [place] [employer] (open 7am - 7pm)  [address], [employer] (open 7am - 7pm) [address], [employer] (open 7am - 7pm) [address], [employer] (open 7am - 7pm) [address], [place]   [facility] (open 7am - 7pm) [address], [place]   [address] (open 7am - 7pm) [address], [employer] (open 7am - 7pm) [address], [place]

## 2017-09-06 NOTE — Progress Notes (Signed)
NEW PATIENT  Date of Service/Encounter:  09/06/17  Referring provider: Beaulah Dinning, MD   Assessment:   Chronic rhinitis  Intrinsic atopic dermatitis  Seborrheic dermatitis  Plan/Recommendations:   1. Chronic rhinitis - We cannot do testing since he took cetirizine last night. - We will do skin testing in 1-2 weeks (he needs to be off of his Zyrtec starting Monday November 5th). - Stop taking: Zyrtec (cetirizine) - Continue with: Singulair (montelukast) 10mg  daily and Flonase (fluticasone) two sprays per nostril daily - Start taking: Xyzal (levocetirizine) 5mg  tablet once daily and Astelin (azelastine) 2 sprays per nostril 1-2 times daily as needed - You can use an extra dose of the antihistamine, if needed, for breakthrough symptoms.  - Consider nasal saline rinses 1-2 times daily to remove allergens from the nasal cavities as well as help with mucous clearance (this is especially helpful to do before the nasal sprays are given) - We will consider allergy shots.   2. Intrinsic atopic dermatitis with seborrheic dermatitis - We will talk to your PCP and get a referral for Dermatology put in.  - Start Eucrisa one application twice daily as needed. Pam Drown has been shown anecdotally to be helpful with hand dermatitis, and since this is the worst area it might prove useful in [place]. - Continue with the moisturizing regimen.   3. Return in about 1 week (around 09/13/2017).   Subjective:   [name] is a 31 y.o. male presenting today for evaluation of  Chief Complaint  Patient presents with  . Allergic Rhinitis     sneezing, runny nose, severe nasal congestion, post nasal drainage.     LANDIS DOWDY has a history of the following: Patient Active Problem List   Diagnosis Date Noted  . Intrinsic atopic dermatitis 09/06/2017  . Chronic rhinitis 09/06/2017  . URI (upper respiratory infection) 12/08/2015  . Seasonal allergies 09/09/2015  . Secondary  anxiety disorder, other type 04/26/2014  . 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 ASD (atrial septal defect) 08/20/2012  . Allergic rhinitis 08/18/2012  . 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 Seborrheic dermatitis 06/07/2011  . 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 AUTISM 07/15/2009  . 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 GLAUCOMA 01/02/2007  . Eczema 01/02/2007    History obtained from: chart review and patient's mother and brother.  [name] was referred by Beaulah Dinning, MD.     [name] is a very pleasant 31 y.o. male with a history of global developmental delay presenting for an evaluation of environmental allergies. Mom reports that he has severe congestion throughout the entire year. Mom reports his head, ear, and nose drain into his chest. It takes a combination of Mucinex and Afrin to control the symptoms when they become particularly. They have needed to go to the PCP around three times annually for this, where he never gets antibiotics. However, it was finally decided to send him to a specialist. He was around 31 years old when this all started. He is currently on Zyrtec, montelukast, and Flonase which he remains on throughout the year.   He does have eczema. He was on a steroid cream but now uses ketoconazole shampoo and Eucerin cream. The shampoo has been prescribed for his hair, where he has problems with dermatitis and severe scratching. He has never seen Dermatology, but Mom is interested in this since the ketoconazole did not  seem to work at all. He is constantly scratching his head. Mom feels that the head condition is related to his ear wax issues as well.    His father and brother also have allergies fairly severely. Mom has intermittent allergies but not to the same extent. He tolerates all of the major food allergens without adverse event.  Otherwise, there is no history of other atopic diseases, including asthma, drug allergies, food allergies, stinging insect allergies, or urticaria. There is no significant infectious history. Vaccinations are up to date.    Past Medical History: Patient Active Problem List   Diagnosis Date Noted  . Intrinsic atopic dermatitis 09/06/2017  . Chronic rhinitis 09/06/2017  . URI (upper respiratory infection) 12/08/2015  . Seasonal allergies 09/09/2015  . Secondary anxiety disorder, other type 04/26/2014  . 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 ASD (atrial septal defect) 08/20/2012  . Allergic rhinitis 08/18/2012  . 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 Seborrheic dermatitis 06/07/2011  . 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 AUTISM 07/15/2009  . 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 GLAUCOMA 01/02/2007  . Eczema 01/02/2007    Medication List:  Allergies as of 09/06/2017      Reactions   Sulfa Antibiotics Other (See Comments)   Violent gastric bleeding      Medication List       Accurate as of 09/06/17 11:08 AM. Always use your most recent med list.          benzoyl peroxide 10 % Liqd Generic drug:  Benzoyl Peroxide WASH UNDER ARMS ONCE DAILY   carbamide peroxide 6.5 % OTIC solution Commonly known as:  DEBROX Place 5 drops into both ears 2 (two) times daily as needed.   cetirizine 10 MG tablet Commonly known as:  ZYRTEC TAKE (1) TABLET BY MOUTH ONCE DAILY.   fluticasone 50 MCG/ACT nasal spray Commonly known as:  FLONASE USE 2 SPRAYS IN EACH NOSTRIL ONCE DAILY.   guaiFENesin 600 MG 12 hr tablet Commonly known as:  MUCINEX Take 1 tablet (600 mg total) by mouth 2 (two) times daily as needed for cough or to loosen phlegm.   ketoconazole 2 % shampoo Commonly known as:  NIZORAL APPLY 1 APPLICATION TOPICALLY TWO TIMES A WEEK.   montelukast 10 MG  tablet Commonly known as:  SINGULAIR TAKE 1 TABLET BY MOUTH AT BEDTIME.   QC LO-DOSE ASPIRIN 81 MG EC tablet Generic drug:  aspirin TAKE 1 TABLET BY MOUTH EVERY MORNING.       Birth History: non-contributory.   Developmental History: non-contributory.   Past Surgical History: Past Surgical History:  Procedure Laterality Date  . ASD REPAIR N/A 11/24/2012   Procedure: ATRIAL SEPTAL DEFECT (ASD) REPAIR;  Surgeon: Tonny BollmanMichael Cooper, MD;  [facility];  Service: Cardiovascular;  Laterality: N/A;  . CARDIAC SURGERY    . TEE WITHOUT CARDIOVERSION  10/23/2012   Procedure: TRANSESOPHAGEAL ECHOCARDIOGRAM (TEE);  Surgeon: Laurey Moralealton S McLean, MD;  [facility];  Service: Cardiovascular;  Laterality: N/A;  Dr. requesting Ermalinda MemosAnes. come over to push propofol/ pt is hard to sedate  . TONSILLECTOMY    . TRANSESOPHAGEAL ECHOCARDIOGRAM W/O CARDIOVERSION N/A 11/24/2012   Procedure: TRANSESOPHAGEAL ECHOCARDIOGRAM W/O CARDIOVERSION;  Surgeon: Tonny BollmanMichael Cooper, MD;  [facility];  Service: Cardiovascular;  Laterality: N/A;     Family History: Family History  Problem Relation Age of Onset  . Hypertension Mother   . Diabetes Mother   .  Heart attack Neg Hx   . Stroke Neg Hx      Social History: [name] lives at home with his mother.  He lives in a 65 year old home.  There are hardwood throughout the home.  They have electric heating and central cooling.  There are no animals inside or outside of the home.  He does have dust mite covers on his bed, but not his pillow.  There is no tobacco exposure.  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    Review of Systems: a 14-point review of systems is pertinent for what is mentioned in HPI.  Otherwise, all other systems were negative. Constitutional: negative other than that listed in the HPI Eyes: negative other  than that listed in the HPI Ears, nose, mouth, throat, and face: negative other than that listed in the HPI Respiratory: negative other than that listed in the HPI Cardiovascular: negative other than that listed in the HPI Gastrointestinal: negative other than that listed in the HPI Genitourinary: negative other than that listed in the HPI Integument: negative other than that listed in the HPI Hematologic: negative other than that listed in the HPI Musculoskeletal: negative other than that listed in the HPI Neurological: negative other than that listed in the HPI Allergy/Immunologic: negative other than that listed in the HPI    Objective:   Blood pressure 100/70, pulse 73, resp. rate 16, height 5\' 11"  (1.803 m), weight 204 lb (92.5 kg), SpO2 97 %. Body mass index is 28.45 kg/m.   Physical Exam:  General: Alert, interactive, in no acute distress. Delayed but cooperative with the exam.  Eyes: No conjunctival injection bilaterally, no discharge on the right, no discharge on the left and no Horner-Trantas dots present. PERRL bilaterally. EOMI without pain. No photophobia.  Ears: Right TM unable to be visualized due to cerumen impaction and Left TM unable to be visualized due to cerumen impaction.  Nose/Throat: External nose within normal limits and septum midline. Turbinates markedly edematous and pale with thick discharge. Posterior oropharynx erythematous with cobblestoning in the posterior oropharynx. Tonsils surgically absent without exudates.  Tongue without thrush. Neck: Supple without thyromegaly. Trachea midline. Adenopathy: shoddy bilateral anterior cervical lymphadenopathy and no enlarged lymph nodes appreciated in the occipital, axillary, epitrochlear, inguinal, or popliteal regions. Lungs: Clear to auscultation without wheezing, rhonchi or rales. No increased work of breathing. CV: Normal S1/S2. No murmurs. Capillary refill <2 seconds.  Abdomen: Nondistended, nontender. No  guarding or rebound tenderness. Bowel sounds present in all fields and hyperactive  Skin: Warm and dry, without lesions or rashes. However, there are impressive areas of scaling within the scalp with excoriations present. He also has some dry scaly lesions on his fingers bilaterally.  Extremities:  No clubbing, cyanosis or edema. There are some erythematous lesions  Neuro:   Grossly intact and at baseline. Responsive to questions although he is somewhat difficult to understand.   Diagnostic studies: deferred due to recent antihistamine use    Malachi Bonds, MD [facility]

## 2017-09-13 ENCOUNTER — Ambulatory Visit (INDEPENDENT_AMBULATORY_CARE_PROVIDER_SITE_OTHER): Payer: Medicaid Other | Admitting: Allergy & Immunology

## 2017-09-13 ENCOUNTER — Encounter: Payer: Self-pay | Admitting: Allergy & Immunology

## 2017-09-13 DIAGNOSIS — J302 Other seasonal allergic rhinitis: Secondary | ICD-10-CM | POA: Diagnosis not present

## 2017-09-13 DIAGNOSIS — J3089 Other allergic rhinitis: Secondary | ICD-10-CM

## 2017-09-13 NOTE — Progress Notes (Signed)
FOLLOW UP  Date of Service/Encounter:  09/13/17   Assessment:   Seasonal and perennial allergic rhinitis (ragweed, grasses, indoor molds, dust mites, cat, dog and cockroach)   Plan/Recommendations:   1. Chronic allergic rhinitis - Testing today showed: ragweed, grasses, indoor molds, dust mites, cat, dog and cockroach - Avoidance measures provided. - Continue with: Xyzal (levocetirizine) 5mg  tablet once daily, Singulair (montelukast) 10mg  daily, Flonase (fluticasone) two sprays per nostril daily and Astelin (azelastine) 2 sprays per nostril 1-2 times daily as needed - You can use an extra dose of the antihistamine, if needed, for breakthrough symptoms.  - Consider nasal saline rinses 1-2 times daily to remove allergens from the nasal cavities as well as help with mucous clearance (this is especially helpful to do before the nasal sprays are given) - Consider allergy shots as a means of long term control.  - She will call back to let us know if she is interested in pursuing shots for TJ.   2. Intrinsic atopic dermatitis with seborrheic dermatitis - We referred [facility] to [facility] and they are supposed to be reaching out to make an appointment (I confirmed this with our Referral Coordinator) - You could call them to make an appointment if you would like: [phone] - Continue with Eucrisa one application twice daily as needed. - Continue with the moisturizing regimen.   3. Return in about 3 months (around 12/14/2017).  Subjective:   [name] is a 31 y.o. male presenting today for follow up of  Chief Complaint  Patient presents with  . Allergy Testing    [name] has a history of the following: Patient Active Problem List   Diagnosis Date Noted  . Intrinsic atopic dermatitis 09/06/2017  . Chronic rhinitis 09/06/2017  . URI (upper respiratory infection) 12/08/2015  . Seasonal allergies 09/09/2015  . Secondary anxiety disorder, other type  04/26/2014  . 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 ASD (atrial septal defect) 08/20/2012  . Allergic rhinitis 08/18/2012  . 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 Seborrheic dermatitis 06/07/2011  . 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 AUTISM 07/15/2009  . 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 GLAUCOMA 01/02/2007  . Eczema 01/02/2007    History obtained from: chart review and patient's mother (the patient has cognitive delays).  [name]'s Primary Care Provider is Beaulah Dinning, MD.     [name] is a 31 y.o. male presenting for a skin testing. He was last seen one week ago for a new patient evaluation for chronic rhinitis. Unfortunately, he had taken antihistamines recently so we could not do testing until now. We continued him on Singulair and Flonase. We added on Xyzal 5mg  daily in lieu of Zyrtec. We also added on Astelin nasal spray. We discussed allergy shots as a means of long term control. We also referred [name] to dermatology for an evaluation.   Since the last visit, he has done well. He has remained off of his antihistamines for testing today. He is traveling to [place] today for [employer], where he is competing in skating.   Otherwise, there have been no changes to his past medical history, surgical history, family history, or social history.    Review of Systems: a 14-point review of systems is pertinent for what is mentioned in HPI.  Otherwise, all other systems were negative. Constitutional: negative other than that listed in the HPI  Eyes: negative other than that listed in the HPI Ears, nose, mouth, throat, and face: negative other than that listed in the HPI Respiratory: negative other than that listed in the HPI Cardiovascular: negative other than that listed in the HPI Gastrointestinal: negative other than that listed in the  HPI Genitourinary: negative other than that listed in the HPI Integument: negative other than that listed in the HPI Hematologic: negative other than that listed in the HPI Musculoskeletal: negative other than that listed in the HPI Neurological: negative other than that listed in the HPI Allergy/Immunologic: negative other than that listed in the HPI    Objective:    Physical Exam: deferred since this was a skin testing visit only    Diagnostic studies:   Allergy Studies:   Indoor/Outdoor Percutaneous Adult Environmental Panel: positive to [place] blue grass and timothy grass. Otherwise negative with adequate controls.  Indoor/Outdoor Selected Intradermal Environmental Panel: positive to ragweed mix, mold mix #4, cat, dog, cockroach and mite mix. Otherwise negative with adequate controls.      Mark BondsJoel Azie Mcconahy, MD FAAAAI [facility]

## 2017-09-13 NOTE — Patient Instructions (Addendum)
1. Chronic allergic rhinitis - Testing today showed: ragweed, grasses, indoor molds, dust mites, cat, dog and cockroach - Avoidance measures provided. - Continue with: Xyzal (levocetirizine) 5mg  tablet once daily, Singulair (montelukast) 10mg  daily, Flonase (fluticasone) two sprays per nostril daily and Astelin (azelastine) 2 sprays per nostril 1-2 times daily as needed - You can use an extra dose of the antihistamine, if needed, for breakthrough symptoms.  - Consider nasal saline rinses 1-2 times daily to remove allergens from the nasal cavities as well as help with mucous clearance (this is especially helpful to do before the nasal sprays are given) - Consider allergy shots as a means of long term control.   2. Intrinsic atopic dermatitis with seborrheic dermatitis - [facility] and they are supposed to be reaching out to make an appointment (I confirmed this with our Referral Coordinator) - You could call them to make an appointment if you would like: [phone] - Continue with Eucrisa one application twice daily as needed. - Continue with the moisturizing regimen.   3. Return in about 3 months (around 12/14/2017).   Please inform us of any Emergency Department visits, hospitalizations, or changes in symptoms. Call us before going to the ED for breathing or allergy symptoms since we might be able to fit you in for a sick visit. Feel free to contact us anytime with any questions, problems, or concerns.  It was a pleasure to see you and your family again today! Enjoy the fall season!  Websites that have reliable patient information: 1. American Academy of Asthma, Allergy, and Immunology: www.aaaai.org 2. Food Allergy Research and Education (FARE): foodallergy.org 3. Mothers of Asthmatics: http://www.asthmacommunitynetwork.org 4. American College of Allergy, Asthma, and Immunology: www.acaai.org  Reducing Pollen Exposure  The American Academy of Allergy,  Asthma and Immunology suggests the following steps to reduce your exposure to pollen during allergy seasons.    1. Do not hang sheets or clothing out to dry; pollen may collect on these items. 2. Do not mow lawns or spend time around freshly cut grass; mowing stirs up pollen. 3. Keep windows closed at night.  Keep car windows closed while driving. 4. Minimize morning activities outdoors, a time when pollen counts are usually at their highest. 5. Stay indoors as much as possible when pollen counts or humidity is high and on windy days when pollen tends to remain in the air longer. 6. Use air conditioning when possible.  Many air conditioners have filters that trap the pollen spores. 7. Use a HEPA room air filter to remove pollen form the indoor air you breathe.  Control of Mold Allergen   Mold and fungi can grow on a variety of surfaces provided certain temperature and moisture conditions exist.  Outdoor molds grow on plants, decaying vegetation and soil.  The major outdoor mold, Alternaria and Cladosporium, are found in very high numbers during hot and dry conditions.  Generally, a late Summer - Fall peak is seen for common outdoor fungal spores.  Rain will temporarily lower outdoor mold spore count, but counts rise rapidly when the rainy period ends.  The most important indoor molds are Aspergillus and Penicillium.  Dark, humid and poorly ventilated basements are ideal sites for mold growth.  The next most common sites of mold growth are the bathroom and the kitchen.   Indoor (Perennial) Mold Control   Positive indoor molds via skin testing: Fusarium, Aureobasidium (Pullulara) and Rhizopus  1. Maintain humidity below 50%. 2. Clean washable  surfaces with 5% bleach solution. 3. Remove sources e.g. contaminated carpets.   Control of House Dust Mite Allergen    House dust mites play a major role in allergic asthma and rhinitis.  They occur in environments with high humidity wherever human  skin, the food for dust mites is found. High levels have been detected in dust obtained from mattresses, pillows, carpets, upholstered furniture, bed covers, clothes and soft toys.  The principal allergen of the house dust mite is found in its feces.  A gram of dust may contain 1,000 mites and 250,000 fecal particles.  Mite antigen is easily measured in the air during house cleaning activities.    1. Encase mattresses, including the box spring, and pillow, in an air tight cover.  Seal the zipper end of the encased mattresses with wide adhesive tape. 2. Wash the bedding in water of 130 degrees Farenheit weekly.  Avoid cotton comforters/quilts and flannel bedding: the most ideal bed covering is the dacron comforter. 3. Remove all upholstered furniture from the bedroom. 4. Remove carpets, carpet padding, rugs, and non-washable window drapes from the bedroom.  Wash drapes weekly or use plastic window coverings. 5. Remove all non-washable stuffed toys from the bedroom.  Wash stuffed toys weekly. 6. Have the room cleaned frequently with a vacuum cleaner and a damp dust-mop.  The patient should not be in a room which is being cleaned and should wait 1 hour after cleaning before going into the room. 7. Close and seal all heating outlets in the bedroom.  Otherwise, the room will become filled with dust-laden air.  An electric heater can be used to heat the room. 8. Reduce indoor humidity to less than 50%.  Do not use a humidifier.  Control of Dog or Cat Allergen  Avoidance is the best way to manage a dog or cat allergy. If you have a dog or cat and are allergic to dog or cats, consider removing the dog or cat from the home. If you have a dog or cat but don't want to find it a new home, or if your family wants a pet even though someone in the household is allergic, here are some strategies that may help keep symptoms at bay:  1. Keep the pet out of your bedroom and restrict it to only a few rooms. Be advised  that keeping the dog or cat in only one room will not limit the allergens to that room. 2. Don't pet, hug or kiss the dog or cat; if you do, wash your hands with soap and water. 3. High-efficiency particulate air (HEPA) cleaners run continuously in a bedroom or living room can reduce allergen levels over time. 4. Regular use of a high-efficiency vacuum cleaner or a central vacuum can reduce allergen levels. 5. Giving your dog or cat a bath at least once a week can reduce airborne allergen.  Control of Cockroach Allergen  Cockroach allergen has been identified as an important cause of acute attacks of asthma, especially in urban settings.  There are fifty-five species of cockroach that exist in the [place], however only three, the [place], [place] species produce allergen that can affect patients with Asthma.  Allergens can be obtained from fecal particles, egg casings and secretions from cockroaches.    1. Remove food sources. 2. Reduce access to water. 3. Seal access and entry points. 4. Spray runways with 0.5-1% Diazinon or Chlorpyrifos 5. Blow boric acid power under stoves and refrigerator. 6. Place bait  stations (hydramethylnon) at feeding sites.

## 2017-10-02 ENCOUNTER — Other Ambulatory Visit: Payer: Self-pay | Admitting: Family Medicine

## 2017-10-02 DIAGNOSIS — L219 Seborrheic dermatitis, unspecified: Secondary | ICD-10-CM

## 2017-10-03 ENCOUNTER — Other Ambulatory Visit: Payer: Self-pay | Admitting: Family Medicine

## 2017-10-03 DIAGNOSIS — L219 Seborrheic dermatitis, unspecified: Secondary | ICD-10-CM

## 2017-11-06 ENCOUNTER — Encounter: Payer: Self-pay | Admitting: Family Medicine

## 2017-11-06 ENCOUNTER — Ambulatory Visit: Payer: Medicaid Other | Admitting: Family Medicine

## 2017-11-06 DIAGNOSIS — R059 Cough, unspecified: Secondary | ICD-10-CM

## 2017-11-06 DIAGNOSIS — R05 Cough: Secondary | ICD-10-CM

## 2017-11-06 MED ORDER — BENZONATATE 100 MG PO CAPS: 100.0000 mg | ORAL_CAPSULE | Freq: Two times a day (BID) | ORAL | 0 refills | Status: DC | PRN

## 2017-11-06 NOTE — Assessment & Plan Note (Signed)
Most consistent with viral bronchitis - treat with tessalon and monitor

## 2017-11-06 NOTE — Patient Instructions (Signed)
Good to see you today!  Thanks for coming in.  Use the Tessalon perles twice a day as needed for cough  If not better in one week or if you has high fever or shortness of breath   Continue your usual medications

## 2017-11-06 NOTE — Progress Notes (Signed)
Subjective  Patient is presenting with the following illnesses  URI  Major symptoms: cough  Has been sick for 10 days. Progression: stated with congestion now mostly cough Medications tried: using all prescription allergy meds Sick contacts: on cruise, his mom has same symptoms Patient believes may be caused by mold on ship  Symptoms Fever: no Headache or face pain: no Tooth pain: no Sneezing: no Scratchy throat: no Allergies: yes see PMH Muscle aches: no Severe fatigue: no Stiff neck: no Shortness of breath: no Rash: no Sore throat or swollen glands: no   ROS see HPI Smoking Status noted  Chief Complaint noted Review of Symptoms - see HPI PMH - Smoking status noted.     Objective Vital Signs reviewed Lungs:  Normal respiratory effort, chest expands symmetrically. Lungs are clear to auscultation, no crackles or wheezes. Heart - Regular rate and rhythm.  No murmurs, gallops or rubs.    Neck:  No deformities, thyromegaly, masses, or tenderness noted.   Supple with full range of motion without pain. Throat: normal mucosa, no exudate, uvula midline, no redness Ears:  External ear exam shows no significant lesions or deformities.  Otoscopic examination reveals clear canals, tympanic membranes are intact bilaterally without bulging, retraction, inflammation or discharge. Hearing is grossly normal bilaterall Skin:  Intact without suspicious lesions or rashes    Assessments/Plans  Cough Most consistent with viral bronchitis - treat with tessalon and monitor    See after visit summary for details of patient instuctions

## 2017-11-11 ENCOUNTER — Other Ambulatory Visit: Payer: Self-pay | Admitting: Family Medicine

## 2017-11-11 DIAGNOSIS — L732 Hidradenitis suppurativa: Secondary | ICD-10-CM

## 2017-11-14 ENCOUNTER — Ambulatory Visit: Payer: Medicaid Other | Admitting: Internal Medicine

## 2017-11-25 ENCOUNTER — Ambulatory Visit: Payer: Medicaid Other | Admitting: Family Medicine

## 2017-11-25 ENCOUNTER — Other Ambulatory Visit: Payer: Self-pay

## 2017-11-25 ENCOUNTER — Encounter: Payer: Self-pay | Admitting: Family Medicine

## 2017-11-25 VITALS — BP 98/62 | HR 77 | Temp 97.8°F | Ht 72.0 in | Wt 211.8 lb

## 2017-11-25 DIAGNOSIS — R05 Cough: Secondary | ICD-10-CM | POA: Diagnosis present

## 2017-11-25 DIAGNOSIS — R059 Cough, unspecified: Secondary | ICD-10-CM

## 2017-11-25 MED ORDER — GUAIFENESIN ER 600 MG PO TB12: 600.0000 mg | ORAL_TABLET | Freq: Two times a day (BID) | ORAL | 3 refills | Status: DC | PRN

## 2017-11-25 NOTE — Patient Instructions (Signed)
Thank you for coming in today, it was so nice to see you! Today we talked about:    Cough: this likely lingering from your URI. No signs of asthma, pneumonia, or bronchitis today. You can take mucinex as needed. Continue all your allergy medications and nasal sprays  If you start having a fever, chills, nausea, vomiting, wheezing, or shortness of breath please come back immediately   We are checking some blood work today.  If you have any questions or concerns, please do not hesitate to call the office at [phone]. You can also message me directly via MyChart.   Sincerely,  Anders Simmonds, MD

## 2017-11-25 NOTE — Assessment & Plan Note (Signed)
Multifactorial with recent viral bronchitis and chronic allergic rhinitis.  No signs of pneumonia today.  Normal respiratory exam and lungs clear to auscultation bilaterally.  Discussed using Mucinex as needed and can use Robitussin at night.  Suspect that this should improve over the next couple weeks.  Return precautions discussed with mother.

## 2017-11-25 NOTE — Progress Notes (Signed)
Subjective:    Patient ID: [name] , male   DOB: 11-May-1986 , 32 y.o..   MRN: [id number]  HPI  [name] is a 32 year old male with past medical history of chronic allergic rhinitis, cough, autism, eczema, ASD, ebstein's anomaly here for   1. COUGH  Onset: 2 weeks ago    Course: started with URI symptoms Severity: Not worsening particular time of day Worse with: Nothing identified Better with: Nothing identified  Symptoms Sputum:no  Fever: no  Shortness of breath:no  Leg Swelling:no  Heart Burn or Reflux:no  Wheezing:no  Post Nasal Drip: yes   Red Flags Weight Loss:  no Immunocompromised:  no  PMH Asthma or COPD: no  PMH of Smoking: no  Using ACEIs: no Has hx of allergies    Review of Systems: Per HPI.   Past Medical History: Patient Active Problem List   Diagnosis Date Noted  . Cough 11/06/2017  . Intrinsic atopic dermatitis 09/06/2017  . Chronic rhinitis 09/06/2017  . URI (upper respiratory infection) 12/08/2015  . Seasonal allergies 09/09/2015  . Secondary anxiety disorder, other type 04/26/2014  . 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 ASD (atrial septal defect) 08/20/2012  . Allergic rhinitis 08/18/2012  . 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 Seborrheic dermatitis 06/07/2011  . 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 AUTISM 07/15/2009  . 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 GLAUCOMA 01/02/2007  . Eczema 01/02/2007    Medications: reviewed   Social Hx:  reports that  has never smoked. he has never used smokeless tobacco.   Objective:   BP 98/62   Pulse 77   Temp 97.8 F (36.6 C) (Oral)   Ht 6' (1.829 m)   Wt 211 lb 12.8 oz (96.1 kg)   SpO2 95%   BMI 28.73 kg/m  Physical Exam  Gen: NAD, alert, cooperative with exam, well-appearing HEENT:     Head: Normocephalic, atraumatic    Neck: No masses palpated. No  goiter. No lymphadenopathy     Ears: External ears normal, no drainage.Tympanic membranes intact, normal light reflex bilaterally, no erythema or bulging    Eyes: PERRLA, EOMI, sclera white, normal conjunctiva    Nose: nasal turbinates moist, left nasal turbinate swollen and edematous with little room for air movement, nasal discharge bilaterally    Throat: moist mucus membranes, no pharyngeal erythema, no tonsillar exudate. Airway is patent Cardiac: Regular rate and rhythm, normal S1/S2, no edema, capillary refill brisk  Respiratory: Clear to auscultation bilaterally, no wheezes, non-labored breathing   Assessment & Plan:  Cough Multifactorial with recent viral bronchitis and chronic allergic rhinitis.  No signs of pneumonia today.  Normal respiratory exam and lungs clear to auscultation bilaterally.  Discussed using Mucinex as needed and can use Robitussin at night.  Suspect that this should improve over the next couple weeks.  Return precautions discussed with mother. Mother would like full blood work today as he has not had blood work in a couple years, orders placed as below Orders Placed This Encounter  Procedures  . CBC with Differential  . Basic Metabolic Panel  . Lipid Panel  . TSH   Meds ordered this encounter  Medications  . guaiFENesin (MUCINEX) 600 MG 12 hr tablet    Sig: Take 1 tablet (600 mg total) by mouth 2 (two) times daily as needed for cough or to loosen phlegm.    Dispense:  30 tablet  Refill:  3    Anders Simmonds, MD [facility], PGY-3

## 2017-11-26 LAB — CBC WITH DIFFERENTIAL/PLATELET
BASOS: 1 %
Basophils Absolute: 0 10*3/uL (ref 0.0–0.2)
EOS (ABSOLUTE): 0.2 10*3/uL (ref 0.0–0.4)
EOS: 5 %
HEMATOCRIT: 45.5 % (ref 37.5–51.0)
Hemoglobin: 15.3 g/dL (ref 13.0–17.7)
IMMATURE GRANS (ABS): 0 10*3/uL (ref 0.0–0.1)
IMMATURE GRANULOCYTES: 0 %
LYMPHS: 39 %
Lymphocytes Absolute: 1.9 10*3/uL (ref 0.7–3.1)
MCH: 27.4 pg (ref 26.6–33.0)
MCHC: 33.6 g/dL (ref 31.5–35.7)
MCV: 82 fL (ref 79–97)
Monocytes Absolute: 0.3 10*3/uL (ref 0.1–0.9)
Monocytes: 7 %
NEUTROS ABS: 2.3 10*3/uL (ref 1.4–7.0)
NEUTROS PCT: 48 %
Platelets: 228 10*3/uL (ref 150–379)
RBC: 5.58 x10E6/uL (ref 4.14–5.80)
RDW: 14.5 % (ref 12.3–15.4)
WBC: 4.7 10*3/uL (ref 3.4–10.8)

## 2017-11-26 LAB — BASIC METABOLIC PANEL
BUN / CREAT RATIO: 20 (ref 9–20)
BUN: 21 mg/dL — AB (ref 6–20)
CALCIUM: 9.7 mg/dL (ref 8.7–10.2)
CHLORIDE: 102 mmol/L (ref 96–106)
CO2: 27 mmol/L (ref 20–29)
CREATININE: 1.03 mg/dL (ref 0.76–1.27)
GFR, EST AFRICAN AMERICAN: 112 mL/min/{1.73_m2} (ref >59)
GFR, EST NON AFRICAN AMERICAN: 97 mL/min/{1.73_m2} (ref >59)
Glucose: 63 mg/dL — ABNORMAL LOW (ref 65–99)
Potassium: 4.3 mmol/L (ref 3.5–5.2)
Sodium: 144 mmol/L (ref 134–144)

## 2017-11-26 LAB — LIPID PANEL
CHOL/HDL RATIO: 2.5 ratio (ref 0.0–5.0)
Cholesterol, Total: 140 mg/dL (ref 100–199)
HDL: 57 mg/dL (ref >39)
LDL CALC: 76 mg/dL (ref 0–99)
Triglycerides: 36 mg/dL (ref 0–149)
VLDL CHOLESTEROL CAL: 7 mg/dL (ref 5–40)

## 2017-11-26 LAB — TSH: TSH: 0.584 u[IU]/mL (ref 0.450–4.500)

## 2017-11-28 ENCOUNTER — Encounter: Payer: Self-pay | Admitting: Family Medicine

## 2018-02-14 ENCOUNTER — Encounter: Payer: Self-pay | Admitting: Family Medicine

## 2018-02-14 ENCOUNTER — Ambulatory Visit (INDEPENDENT_AMBULATORY_CARE_PROVIDER_SITE_OTHER): Payer: Medicaid Other | Admitting: Family Medicine

## 2018-02-14 ENCOUNTER — Other Ambulatory Visit: Payer: Self-pay

## 2018-02-14 VITALS — BP 94/60 | HR 75 | Temp 97.9°F | Ht 72.0 in | Wt 207.0 lb

## 2018-02-14 DIAGNOSIS — Q221 Congenital pulmonary valve stenosis: Secondary | ICD-10-CM

## 2018-02-14 DIAGNOSIS — Z0000 Encounter for general adult medical examination without abnormal findings: Secondary | ICD-10-CM

## 2018-02-14 DIAGNOSIS — Q211 Atrial septal defect, unspecified: Secondary | ICD-10-CM

## 2018-02-14 NOTE — Progress Notes (Signed)
Subjective:  [name] is a 32 y.o. 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  Concerns today include:  1. 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 Would like to have his form filled out so he can participate in the sports.   Review of Systems  Constitutional: Negative for fever and weight loss.  HENT: Negative for ear pain, hearing loss and sinus pain.   Eyes: Negative for blurred vision.  Respiratory: Negative for cough, shortness of breath and wheezing.   Cardiovascular: Negative for chest pain and leg swelling.  Gastrointestinal: Negative for abdominal pain, blood in stool, constipation, diarrhea, heartburn, melena, nausea and vomiting.  Genitourinary: Negative for dysuria and frequency.  Musculoskeletal: Negative for back pain and joint pain.  Skin: Negative for rash.  Neurological: Negative for dizziness, tingling, focal weakness and headaches.  Psychiatric/Behavioral: Negative for depression and suicidal ideas.    General Healthcare: Medication Compliance: yes Dx Hypertension: no Dx Hyperlipidemia: no Diabetes: no Dx Obesity: no Weight Loss: no Physical Activity: yes plays sports Urinary Incontinence: no  Last dental exam: 6 months  Social:  reports that he has never smoked. He has never used smokeless tobacco. Driving: Does not drive himself Alcohol Use: No  Tobacco No  Other Drugs: No  Support and Life at Home: lives in a group home, has support from his brother and mother who help run the group home Advanced Directives: no Work: [employer]   Past Medical History Past Medical History:  Diagnosis Date  . ASD (atrial septal defect)    a. 11/2012 ASD closure of ostium secundum ASD w/ 16mm Amplatzer septal occluder device;  b. 11/2012 Echo: EF 55-60%, mild MR, Triv PS, ASD  closure device in place.  . Autism disorder   . History of echocardiogram    Echo 11/16: EF 55-60%, no RWMA, Gr 2 DD, mild MR, s/p ASD closure with no evidence of shunt, trivial TR, mild Pulmonic stenosis, trivial PI // Echo 11/17: EF 55-60, prominent trabeculation toward the apex, normal wall motion, grade 2 diastolic dysfunction, normal RVSF, mild RAE, ASD closure device well-seated without evidence for leakage, peak RV-RA gradient 27, mild PS (peak 9), PASP 30   . Mental retardation   . Pulmonary artery stenosis, main    Patient Active Problem List   Diagnosis Date Noted  . Intrinsic atopic dermatitis 09/06/2017  . Chronic rhinitis 09/06/2017  . Seasonal allergies 09/09/2015  . Secondary anxiety disorder, other type 04/26/2014  . 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 ASD (atrial septal defect) 08/20/2012  . Allergic rhinitis 08/18/2012  . 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 Seborrheic dermatitis 06/07/2011  . 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 AUTISM 07/15/2009  . 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 GLAUCOMA 01/02/2007  . Eczema 01/02/2007    Medications- reviewed and updated Current Outpatient Medications  Medication Sig Dispense Refill  . azelastine (ASTELIN) 0.1 % nasal spray 2 sprays in each nostril as 1-2 times daily as needed 30 mL 5  . benzonatate (TESSALON) 100 MG capsule Take 1 capsule (100 mg total) by mouth 2 (two) times daily as needed for cough. 20 capsule 0  . BENZOYL PEROXIDE 10 % external wash WASH UNDER ARMS ONCE DAILY 237 g 11  . carbamide peroxide (DEBROX) 6.5 % otic solution Place 5  drops into both ears 2 (two) times daily as needed. 15 mL 0  . cetirizine (ZYRTEC) 10 MG tablet TAKE (1) TABLET BY MOUTH ONCE DAILY. 30 tablet 11  . Crisaborole (EUCRISA) 2 % OINT Apply 1 application topically 2 (two) times daily as needed. 60 g 5  . fluticasone (FLONASE) 50 MCG/ACT nasal  spray USE 2 SPRAYS IN EACH NOSTRIL ONCE DAILY. 16 g 11  . ketoconazole (NIZORAL) 2 % shampoo APPLY 1 APPLICATION TOPICALLY TWO TIMES A WEEK. 120 mL 11  . levocetirizine (XYZAL) 5 MG tablet Take 1 tablet (5 mg total) by mouth every evening. 30 tablet 5  . montelukast (SINGULAIR) 10 MG tablet TAKE 1 TABLET BY MOUTH AT BEDTIME. 30 tablet 11  . QC LO-DOSE ASPIRIN 81 MG EC tablet TAKE 1 TABLET BY MOUTH EVERY MORNING. 30 tablet 11  . guaiFENesin (MUCINEX) 600 MG 12 hr tablet Take 1 tablet (600 mg total) by mouth 2 (two) times daily as needed for cough or to loosen phlegm. (Patient not taking: Reported on 02/14/2018) 30 tablet 3   No current facility-administered medications for this visit.     Objective: BP 94/60   Pulse 75   Temp 97.9 F (36.6 C) (Oral)   Ht 6' (1.829 m)   Wt 207 lb (93.9 kg)   SpO2 97%   BMI 28.07 kg/m  Gen: In no acute distress, alert, cooperative with exam, well groomed HEENT: NCAT, EOMI, PERRL CV: Regular rate and rhythm, normal S1/S2, no murmur Resp: Clear to auscultation bilaterally, no wheezes, non-labored Abd: Soft, Non Tender, Non Distended, bowel sounds present, no guarding or organomegaly Ext: No edema, warm and well perfused Neuro: Alert and oriented, No gross deficits, normal gait Psych: Normal mood and affect   Assessment/Plan:  Annual Physical: Patient doing well overall.  He is mostly here for a [employer] form.  This form was filled out for him.  He has regular follow-up with his cardiologist and there are no alarming symptoms (see below assessment and plan).  He is also following with an allergy specialist.   ASD (atrial septal defect) Patient follows with Dr. Shirlee Latch. He has pulmonic stenosis and a secundum ASD.  There is note in the chart of Ebstein's anomaly but this was not seen on last echo or cMRI according to Dr. Alford Highland note. Was seen Jan 2018 by cardiology. No alarming symptoms such as syncope, lightheadedness, shortness of breath or  chest pain. Continue to follow with cardiology.     Anders Simmonds, MD [facility], PGY-3

## 2018-02-17 NOTE — Assessment & Plan Note (Signed)
Patient follows with Dr. Shirlee Latch. He has pulmonic stenosis and a secundum ASD.  There is note in the chart of Ebstein's anomaly but this was not seen on last echo or cMRI according to Dr. Alford Highland note. Was seen Jan 2018 by cardiology. No alarming symptoms such as syncope, lightheadedness, shortness of breath or chest pain. Continue to follow with cardiology.

## 2018-03-10 ENCOUNTER — Other Ambulatory Visit: Payer: Self-pay | Admitting: Allergy & Immunology

## 2018-04-23 ENCOUNTER — Ambulatory Visit: Payer: Medicaid Other | Admitting: Family Medicine

## 2018-05-12 ENCOUNTER — Ambulatory Visit: Payer: Medicaid Other | Admitting: Family Medicine

## 2018-05-12 ENCOUNTER — Other Ambulatory Visit: Payer: Self-pay

## 2018-05-12 ENCOUNTER — Encounter: Payer: Self-pay | Admitting: Family Medicine

## 2018-05-12 VITALS — BP 104/70 | HR 78 | Temp 98.5°F | Wt 208.0 lb

## 2018-05-12 DIAGNOSIS — Z789 Other specified health status: Secondary | ICD-10-CM

## 2018-05-12 DIAGNOSIS — Z593 Problems related to living in residential institution: Secondary | ICD-10-CM

## 2018-05-12 NOTE — Progress Notes (Signed)
    Subjective:  [name] is a 32 y.o. male who presents to the [facility] today with a chief complaint of getting fL2 form filled out for group home.   HPI: He has no new physical complaints.  He still competes in [employer] sports and is doing well.  His mother, who works at the home, is with him and all they need is paperwork filled out.   Objective:  Physical Exam: BP 104/70   Pulse 78   Temp 98.5 F (36.9 C) (Oral)   Wt 208 lb (94.3 kg)   SpO2 98%   BMI 28.21 kg/m   Gen: NAD, resting comfortably, dysmorphic features CV: RRR with 3/6 known murmur Pulm: NWOB, CTAB with no crackles, wheezes, or rhonchi GI: Soft, Nontender, Nondistended. MSK: no edema, cyanosis, or clubbing noted Skin: warm, dry Neuro: grossly normal, moves all extremities Psych: verbal autistic, pleasant and able to answer questions  No results found for this or any previous visit (from the past 72 hour(s)).   Assessment/Plan:  Lives in group home Doing well in group home, needs new FL2 filled out and medicine list reconciled   Marthenia Rolling, DO FAMILY MEDICINE RESIDENT - PGY2 05/12/2018 1:50 PM

## 2018-05-12 NOTE — Patient Instructions (Signed)
It was a pleasure to see you today! Thank you for choosing [facility] for your primary care. [name] was seen for getting a FL2 form filled out. Come back to the clinic if you have any new concerns, and go to the emergency room if you have any life threatening symptoms.  If we did any lab work today that did not result today, one of two things will happen.  1. If everything is normal, you will get a letter in mail sent to the address in your chart with the results for your records.  It is important to keep your address up to date as that is where we will send results.  2. If the results require some sort of discussion, my nurses or myself will call you on the phone number listed in your records.  It is important to keep your phone number up to date in our system as this is how we will try to reach you.  If we cannot reach you on the phone, we will try to send you a letter in the mail so please enable to voicemail function of your phone.  If you don't hear from [place] in two weeks, please give [place] a call to verify your results. Otherwise, we look forward to seeing you again at your next visit. If you have any questions or concerns before then, please call the clinic at [phone].   Please bring all your medications to every doctors visit   Sign up for My Chart to have easy access to your labs results, and communication with your Primary care physician.     Please check-out at the front desk before leaving the clinic.     Best,  Dr. Marthenia Rolling FAMILY MEDICINE RESIDENT - PGY1 05/12/2018 1:43 PM

## 2018-05-12 NOTE — Assessment & Plan Note (Signed)
Doing well in group home, needs new FL2 filled out and medicine list reconciled

## 2018-07-24 ENCOUNTER — Other Ambulatory Visit: Payer: Self-pay | Admitting: Family Medicine

## 2018-07-28 ENCOUNTER — Ambulatory Visit (INDEPENDENT_AMBULATORY_CARE_PROVIDER_SITE_OTHER): Payer: Medicaid Other | Admitting: *Deleted

## 2018-07-28 DIAGNOSIS — Z23 Encounter for immunization: Secondary | ICD-10-CM | POA: Diagnosis not present

## 2018-09-02 ENCOUNTER — Other Ambulatory Visit: Payer: Self-pay | Admitting: Family Medicine

## 2018-10-01 ENCOUNTER — Other Ambulatory Visit: Payer: Self-pay | Admitting: Internal Medicine

## 2018-10-01 ENCOUNTER — Other Ambulatory Visit: Payer: Self-pay | Admitting: Family Medicine

## 2018-10-01 DIAGNOSIS — L219 Seborrheic dermatitis, unspecified: Secondary | ICD-10-CM

## 2018-10-31 ENCOUNTER — Other Ambulatory Visit: Payer: Self-pay | Admitting: Family Medicine

## 2018-10-31 DIAGNOSIS — L732 Hidradenitis suppurativa: Secondary | ICD-10-CM

## 2019-02-18 ENCOUNTER — Other Ambulatory Visit: Payer: Self-pay | Admitting: Allergy & Immunology

## 2019-02-23 ENCOUNTER — Other Ambulatory Visit: Payer: Self-pay | Admitting: Family Medicine

## 2019-05-06 ENCOUNTER — Ambulatory Visit (INDEPENDENT_AMBULATORY_CARE_PROVIDER_SITE_OTHER): Payer: Medicaid Other | Admitting: Family Medicine

## 2019-05-06 ENCOUNTER — Other Ambulatory Visit: Payer: Self-pay

## 2019-05-06 ENCOUNTER — Encounter: Payer: Self-pay | Admitting: Family Medicine

## 2019-05-06 VITALS — BP 102/64 | HR 76 | Wt 197.0 lb

## 2019-05-06 DIAGNOSIS — L2084 Intrinsic (allergic) eczema: Secondary | ICD-10-CM | POA: Diagnosis not present

## 2019-05-06 DIAGNOSIS — Z23 Encounter for immunization: Secondary | ICD-10-CM

## 2019-05-06 DIAGNOSIS — J310 Chronic rhinitis: Secondary | ICD-10-CM | POA: Diagnosis not present

## 2019-05-06 DIAGNOSIS — Z593 Problems related to living in residential institution: Secondary | ICD-10-CM

## 2019-05-06 DIAGNOSIS — L219 Seborrheic dermatitis, unspecified: Secondary | ICD-10-CM

## 2019-05-06 NOTE — Progress Notes (Signed)
Established Patient - Clinic Visit Subjective  Subjective  Patient ID: MRN [id number]  Date of birth: 1985/12/03   PCP: Marthenia Rolling, DO Name: [name], 33 y.o. male   CC: Form Completion  # Dry hands   # Residential facility - supervised living w/ 2 other men   PS and ASD - s/p ASD. Goes to cardiology (Dr. Shirlee Latch every other year for an echo. ASD is fixed, PS not.    Stopped ketoconazole cream for his hands but has been bleeding more often without it.  When hands get dry, they crack and bleed. Would like to go back on ketoconazole    Glaucoma- pediatric ophthal - goes every other 2 years. 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 And stigmatism.    ROS: See HPI  HISTORY Meds  Allergies: Reviewed as appropriate  Pertinent PMHx: Autism, glaucoma, strabismus, eczema, seborrheic dermatitis, pulmonic stenosis congenital, ASD status post mesh placement, pulmonary artery aneurysm, ID, chronic tinnitus, lives in a group home Past Medical History:  Diagnosis Date  . ASD (atrial septal defect)    a. 11/2012 ASD closure of ostium secundum ASD w/ 14mm Amplatzer septal occluder device;  b. 11/2012 Echo: EF 55-60%, mild MR, Triv PS, ASD closure device in place.  . Autism disorder   . History of echocardiogram    Echo 11/16: EF 55-60%, no RWMA, Gr 2 DD, mild MR, s/p ASD closure with no evidence of shunt, trivial TR, mild Pulmonic stenosis, trivial PI // Echo 11/17: EF 55-60, prominent trabeculation toward the apex, normal wall motion, grade 2 diastolic dysfunction, normal RVSF, mild RAE, ASD closure device well-seated without evidence for leakage, peak RV-RA gradient 27, mild PS (peak 9), PASP 30   . Mental retardation   . Pulmonary artery stenosis, main    Social Hx: [name] reports that he has never smoked. He has never used smokeless tobacco. He reports that he does not drink alcohol or use drugs. Social History   Social History Narrative   Completed high school with certificate.  Goes to day program (but not currently as COVID). M-F: mathemetics, relationships, education.       Objective   Objective  Physical Exam:  BP 102/64   Pulse 76   Wt 197 lb (89.4 kg)   SpO2 97%   BMI 26.72 kg/m   Gen: NAD, alert, non-toxic, well-nourished, well-appearing, pleasant HEENT: Normocephaic, atraumatic. PERRLA, clear conjuctiva, no scleral icterus and injection. Normal EOM. Hearing intact. TM pearly grey bilaterally with no fluid.  Neck supple with no LAD, nodules, or gross abnormality.  Nares patent with no discharge.  Maxillary and frontal sinuses nontender to palpation.  Oropharynx without erythema and lesions.  Tonsils nonswollen and without exudate.   CV: Regular rate and rhythm. Normal S1-S2.  No murmur, gallops, S3, S4 appreciated.  Normal capillary refill bilaterally.  Radial pulses 2+ bilaterally. No bilateral lower extremity edema. Resp: Clear to auscultation bilaterally.  No wheezing, rales, rhonchi, or other abnormal lung sounds.  No increased work of breathing appreciated. Abd: Nontender and nondistended on palpation to all 4 quadrants.  Positive bowel sounds. Skin: No obvious lesions, or trauma.  Normal turgor. Bilateral hands are dry with minor cracks at extensor sites. MSK: Normal ROM. Normal strength and tone.  Neuro: Cranial nerves II through VI grossly intact. Gait normal.  No obvious abnormal movements. Psych: Pleasant. Plays on phone for most of exam, but cooperative and follows commands.  Genitourinary: deferred.   Pertinent Labs & Imaging:  2019 labs  wnl.   2017 echo - EF 55-60%, G2DD, followed by cardiology  Reviewed in chart as appropriate   Assessment  Assessment & Plan  Chronic rhinitis No refills requested today  Seborrheic dermatitis No scalp abnormalities today aside from minor skin flaking.  Continue ketoconazole shampoo once weekly.  Eczema Expressed importance of hydration with creams at least twice daily.  Use Eucrisa for severe  eczema exacerbations.   Lives in group home [place] 2 filled out today.   Cardiac abnormalities: followed by cardiology. Mother reports seeing cardiologist every other year. No murmurs or abnormalities on exam today. Patient denies SOB, DOE, BLEE.    Zettie Cooley, M.D. [place]  PGY -1 05/08/2019, 12:42 PM

## 2019-05-06 NOTE — Patient Instructions (Signed)
Dear [name],   It was very nice to see you! Thank you for taking your time to come in to be seen. Today, we discussed the following:   Eczema   Use cream and moisturizer at least twice daily to prevent eczema exacerbations. Use Eucrisa for when symptoms worsen.   Please follow up in one year for annual check up or sooner for concerning or worsening symptoms.   Be well,   Dr. Zettie Cooley [facility] [phone]   Sign up for MyChart for instant access to your health profile, labs, orders, upcoming appointments or to contact your provider with questions.

## 2019-05-08 ENCOUNTER — Encounter: Payer: Self-pay | Admitting: Family Medicine

## 2019-05-08 NOTE — Assessment & Plan Note (Signed)
>>  ASSESSMENT AND PLAN FOR CHRONIC RHINITIS WRITTEN ON 05/08/2019 12:39 PM BY Melene Plan, MD  No refills requested today

## 2019-05-08 NOTE — Assessment & Plan Note (Signed)
Expressed importance of hydration with creams at least twice daily.  Use Eucrisa for severe eczema exacerbations.

## 2019-05-08 NOTE — Assessment & Plan Note (Signed)
No refills requested today

## 2019-05-08 NOTE — Assessment & Plan Note (Signed)
" >>  ASSESSMENT AND PLAN FOR ECZEMA WRITTEN ON 05/08/2019 12:41 PM BY Gaylen Pereira E, MD  Expressed importance of hydration with creams at least twice daily.  Use Eucrisa  for severe eczema exacerbations.  "

## 2019-05-08 NOTE — Assessment & Plan Note (Signed)
FL 2 filled out today.

## 2019-05-08 NOTE — Assessment & Plan Note (Signed)
No scalp abnormalities today aside from minor skin flaking.  Continue ketoconazole shampoo once weekly.

## 2019-07-20 ENCOUNTER — Ambulatory Visit (INDEPENDENT_AMBULATORY_CARE_PROVIDER_SITE_OTHER): Payer: Medicaid Other | Admitting: *Deleted

## 2019-07-20 ENCOUNTER — Other Ambulatory Visit: Payer: Self-pay

## 2019-07-20 DIAGNOSIS — Z23 Encounter for immunization: Secondary | ICD-10-CM | POA: Diagnosis present

## 2019-07-20 NOTE — Progress Notes (Signed)
Pt tolerated vaccine well. Deseree Blount, CMA  

## 2019-08-01 ENCOUNTER — Other Ambulatory Visit: Payer: Self-pay | Admitting: Family Medicine

## 2019-08-24 ENCOUNTER — Other Ambulatory Visit: Payer: Self-pay

## 2019-08-24 ENCOUNTER — Emergency Department (HOSPITAL_BASED_OUTPATIENT_CLINIC_OR_DEPARTMENT_OTHER): Payer: Medicaid Other

## 2019-08-24 ENCOUNTER — Encounter (HOSPITAL_BASED_OUTPATIENT_CLINIC_OR_DEPARTMENT_OTHER): Payer: Self-pay | Admitting: *Deleted

## 2019-08-24 ENCOUNTER — Emergency Department (HOSPITAL_BASED_OUTPATIENT_CLINIC_OR_DEPARTMENT_OTHER)
Admission: EM | Admit: 2019-08-24 | Discharge: 2019-08-24 | Disposition: A | Discharge status: Home / Self Care | Payer: Medicaid Other | Attending: Emergency Medicine | Admitting: Emergency Medicine

## 2019-08-24 DIAGNOSIS — Z79899 Other long term (current) drug therapy: Secondary | ICD-10-CM | POA: Diagnosis not present

## 2019-08-24 DIAGNOSIS — N390 Urinary tract infection, site not specified: Secondary | ICD-10-CM

## 2019-08-24 DIAGNOSIS — Q256 Stenosis of pulmonary artery: Secondary | ICD-10-CM | POA: Insufficient documentation

## 2019-08-24 DIAGNOSIS — Z7982 Long term (current) use of aspirin: Secondary | ICD-10-CM | POA: Insufficient documentation

## 2019-08-24 DIAGNOSIS — F840 Autistic disorder: Secondary | ICD-10-CM | POA: Insufficient documentation

## 2019-08-24 DIAGNOSIS — R1084 Generalized abdominal pain: Secondary | ICD-10-CM | POA: Diagnosis not present

## 2019-08-24 DIAGNOSIS — R5381 Other malaise: Secondary | ICD-10-CM | POA: Diagnosis present

## 2019-08-24 DIAGNOSIS — T508X5A Adverse effect of diagnostic agents, initial encounter: Secondary | ICD-10-CM

## 2019-08-24 DIAGNOSIS — Y658 Other specified misadventures during surgical and medical care: Secondary | ICD-10-CM | POA: Diagnosis not present

## 2019-08-24 DIAGNOSIS — Q9389 Other deletions from the autosomes: Secondary | ICD-10-CM | POA: Insufficient documentation

## 2019-08-24 DIAGNOSIS — T80818A Extravasation of other vesicant agent, initial encounter: Secondary | ICD-10-CM | POA: Insufficient documentation

## 2019-08-24 LAB — CBC WITH DIFFERENTIAL/PLATELET
Abs Immature Granulocytes: 0.02 10*3/uL (ref 0.00–0.07)
Basophils Absolute: 0 10*3/uL (ref 0.0–0.1)
Basophils Relative: 1 %
Eosinophils Absolute: 0.2 10*3/uL (ref 0.0–0.5)
Eosinophils Relative: 3 %
HCT: 45 % (ref 39.0–52.0)
Hemoglobin: 14.2 g/dL (ref 13.0–17.0)
Immature Granulocytes: 0 %
Lymphocytes Relative: 25 %
Lymphs Abs: 1.6 10*3/uL (ref 0.7–4.0)
MCH: 27.4 pg (ref 26.0–34.0)
MCHC: 31.6 g/dL (ref 30.0–36.0)
MCV: 86.9 fL (ref 80.0–100.0)
Monocytes Absolute: 0.4 10*3/uL (ref 0.1–1.0)
Monocytes Relative: 6 %
Neutro Abs: 4.2 10*3/uL (ref 1.7–7.7)
Neutrophils Relative %: 65 %
Platelets: 194 10*3/uL (ref 150–400)
RBC: 5.18 MIL/uL (ref 4.22–5.81)
RDW: 13.6 % (ref 11.5–15.5)
WBC: 6.4 10*3/uL (ref 4.0–10.5)
nRBC: 0 % (ref 0.0–0.2)

## 2019-08-24 LAB — URINALYSIS, ROUTINE W REFLEX MICROSCOPIC
Bilirubin Urine: NEGATIVE
Glucose, UA: NEGATIVE mg/dL
Hgb urine dipstick: NEGATIVE
Ketones, ur: NEGATIVE mg/dL
Leukocytes,Ua: NEGATIVE
Nitrite: POSITIVE — AB
Protein, ur: 100 mg/dL — AB
Specific Gravity, Urine: 1.025 (ref 1.005–1.030)
pH: 7 (ref 5.0–8.0)

## 2019-08-24 LAB — LIPASE, BLOOD: Lipase: 45 U/L (ref 11–51)

## 2019-08-24 LAB — COMPREHENSIVE METABOLIC PANEL
ALT: 14 U/L (ref 0–44)
AST: 17 U/L (ref 15–41)
Albumin: 4.4 g/dL (ref 3.5–5.0)
Alkaline Phosphatase: 55 U/L (ref 38–126)
Anion gap: 12 (ref 5–15)
BUN: 14 mg/dL (ref 6–20)
CO2: 29 mmol/L (ref 22–32)
Calcium: 9.1 mg/dL (ref 8.9–10.3)
Chloride: 99 mmol/L (ref 98–111)
Creatinine, Ser: 0.93 mg/dL (ref 0.61–1.24)
GFR calc Af Amer: >60 mL/min (ref >60)
GFR calc non Af Amer: >60 mL/min (ref >60)
Glucose, Bld: 88 mg/dL (ref 70–99)
Potassium: 3.7 mmol/L (ref 3.5–5.1)
Sodium: 140 mmol/L (ref 135–145)
Total Bilirubin: 1.6 mg/dL — ABNORMAL HIGH (ref 0.3–1.2)
Total Protein: 7.9 g/dL (ref 6.5–8.1)

## 2019-08-24 LAB — URINALYSIS, MICROSCOPIC (REFLEX)

## 2019-08-24 LAB — LACTIC ACID, PLASMA: Lactic Acid, Venous: 1.1 mmol/L (ref 0.5–1.9)

## 2019-08-24 MED ORDER — SODIUM CHLORIDE 0.9 % IV BOLUS
1000.0000 mL | Freq: Once | INTRAVENOUS | Status: AC
  Administered 2019-08-24: 1000 mL via INTRAVENOUS

## 2019-08-24 MED ORDER — CEPHALEXIN 250 MG PO CAPS
500.0000 mg | ORAL_CAPSULE | Freq: Once | ORAL | Status: AC
  Administered 2019-08-24: 21:00:00 500 mg via ORAL
  Filled 2019-08-24: qty 2

## 2019-08-24 MED ORDER — IOHEXOL 300 MG/ML  SOLN
100.0000 mL | Freq: Once | INTRAMUSCULAR | Status: AC
  Administered 2019-08-24: 100 mL via INTRAVENOUS

## 2019-08-24 MED ORDER — CEPHALEXIN 500 MG PO CAPS: 500.0000 mg | ORAL_CAPSULE | Freq: Four times a day (QID) | ORAL | 0 refills | Status: AC

## 2019-08-24 MED ORDER — MORPHINE SULFATE (PF) 2 MG/ML IV SOLN
2.0000 mg | Freq: Once | INTRAVENOUS | Status: AC
  Administered 2019-08-24: 2 mg via INTRAVENOUS
  Filled 2019-08-24: qty 1

## 2019-08-24 MED ORDER — PHENAZOPYRIDINE HCL 200 MG PO TABS: 200.0000 mg | ORAL_TABLET | Freq: Three times a day (TID) | ORAL | 0 refills | Status: DC

## 2019-08-24 NOTE — ED Provider Notes (Signed)
[facility] EMERGENCY DEPARTMENT Provider Note   CSN: [id number] Arrival date & time: 08/24/19  1254     History   Chief Complaint Chief Complaint  Patient presents with   Fall    HPI [name] is a 33 y.o. male with a past medical history of 8P chromosomal deletion syndrome, autism, cardiac abnormalities including pulmonary artery stenosis, atrial septal defect status post closure, who presents today for evaluation of feeling unwell, abdominal pain, and collapse. History is obtained from patient and mother. Patient was with his one-on-one aide at his group home when he started getting quiet and saying he was not feeling well.  While walking his legs collapsed.  He did not pass out or lose consciousness.  Per his mother he did not strike his head. Mom reports that he has been well recently.  He has had urinary tract infections before with blood in his urine. He reports abdominal pain.  He tells me that it hurts a little bit.  He denies nausea or vomiting. When I asked him to point to where his stomach hurts he points to the area around his umbilicus.  He has never had any abdominal surgeries before.  Mother reports that he looks like he feels unwell to her.  That he appears sleepy, and is not as talkative as normal.     HPI  Past Medical History:  Diagnosis Date   ASD (atrial septal defect)    a. 11/2012 ASD closure of ostium secundum ASD w/ 74mm Amplatzer septal occluder device;  b. 11/2012 Echo: EF 55-60%, mild MR, Triv PS, ASD closure device in place.   Autism disorder    History of echocardiogram    Echo 11/16: EF 55-60%, no RWMA, Gr 2 DD, mild MR, s/p ASD closure with no evidence of shunt, trivial TR, mild Pulmonic stenosis, trivial PI // Echo 11/17: EF 55-60, prominent trabeculation toward the apex, normal wall motion, grade 2 diastolic dysfunction, normal RVSF, mild RAE, ASD closure device well-seated without evidence for leakage, peak RV-RA gradient  27, mild PS (peak 9), PASP 30    Mental retardation    Pulmonary artery stenosis, main     Patient Active Problem List   Diagnosis Date Noted   Lives in group home 05/12/2018   Intrinsic atopic dermatitis 09/06/2017   Chronic rhinitis 09/06/2017   Seasonal allergies 09/09/2015   Secondary anxiety disorder, other type 04/26/2014   Pulmonary artery aneurysm ([place]) 12/24/2012   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   ASD (atrial septal defect) 08/20/2012   Allergic rhinitis 08/18/2012   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   Seborrheic dermatitis 06/07/2011   Pulmonic stenosis, congenital 06/07/2011   AUTISM 07/15/2009   STRABISMUS, MECHANICAL D/T MUSCLE DISORDER 07/09/2007   GLAUCOMA 01/02/2007   Eczema 01/02/2007    Past Surgical History:  Procedure Laterality Date   ASD REPAIR N/A 11/24/2012   Procedure: ATRIAL SEPTAL DEFECT (ASD) REPAIR;  Surgeon: Sherren Mocha, MD;  Location: [facility] CATH LAB;  Service: Cardiovascular;  Laterality: N/A;   CARDIAC SURGERY     TEE WITHOUT CARDIOVERSION  10/23/2012   Procedure: TRANSESOPHAGEAL ECHOCARDIOGRAM (TEE);  Surgeon: Larey Dresser, MD;  Location: [facility];  Service: Cardiovascular;  Laterality: N/A;  Dr. requesting Scheryl Darter. come over to push propofol/ pt is hard to sedate   TONSILLECTOMY     TRANSESOPHAGEAL ECHOCARDIOGRAM W/O CARDIOVERSION N/A 11/24/2012   Procedure: TRANSESOPHAGEAL ECHOCARDIOGRAM W/O CARDIOVERSION;  Surgeon: Sherren Mocha, MD;  Location: [facility] CATH LAB;  Service: Cardiovascular;  Laterality: N/A;        Home Medications    Prior to Admission medications   Medication Sig Start Date End Date Taking? Authorizing Provider  aspirin (QC LO-DOSE ASPIRIN) 81 MG EC tablet TAKE 1 TABLET BY MOUTH EVERY MORNING. 09/03/18   Marthenia RollingBland, Scott, DO  azelastine (ASTELIN) 0.1 % nasal spray 2 sprays in each nostril as 1-2 times daily as needed 09/06/17   Alfonse SpruceGallagher, Joel Louis, MD  BENZOYL PEROXIDE 10 % external  wash [facility] ONCE DAILY 11/03/18   Marthenia RollingBland, Scott, DO  carbamide peroxide (DEBROX) 6.5 % otic solution Place 5 drops into both ears 2 (two) times daily as needed. 02/06/17   Mikell, Antionette PolesAsiyah Zahra, MD  cephALEXin (KEFLEX) 500 MG capsule Take 1 capsule (500 mg total) by mouth 4 (four) times daily for 10 days. 08/24/19 09/03/19  Cristina GongHammond, Macklin Jacquin W, PA-C  cetirizine (ZYRTEC) 10 MG tablet TAKE (1) TABLET BY MOUTH ONCE DAILY. 08/12/17   Beaulah DinningGambino, Christina M, MD  Crisaborole (EUCRISA) 2 % OINT Apply 1 application topically 2 (two) times daily as needed. 09/06/17   Alfonse SpruceGallagher, Joel Louis, MD  fluticasone (FLONASE) 50 MCG/ACT nasal spray USE 2 SPRAYS IN EACH NOSTRIL ONCE DAILY. 10/02/18   Marthenia RollingBland, Scott, DO  ketoconazole (NIZORAL) 2 % shampoo APPLY 1 APPLICATION TOPICALLY TWO TIMES A WEEK. 10/02/18   Marthenia RollingBland, Scott, DO  levocetirizine (XYZAL) 5 MG tablet TAKE 1 TABLET EACH EVENING. 02/24/19   Bland, Scott, DO  montelukast (SINGULAIR) 10 MG tablet TAKE 1 TABLET BY MOUTH AT BEDTIME. 07/24/18   Marthenia RollingBland, Scott, DO  phenazopyridine (PYRIDIUM) 200 MG tablet Take 1 tablet (200 mg total) by mouth 3 (three) times daily with meals. 08/24/19   Cristina GongHammond, Theresa Wedel W, PA-C    Family History Family History  Problem Relation Age of Onset   Hypertension Mother    Diabetes Mother    Heart attack Neg Hx    Stroke Neg Hx     Social History Social History   Tobacco Use   Smoking status: Never Smoker   Smokeless tobacco: Never Used  Substance Use Topics   Alcohol use: No    Alcohol/week: 0.0 standard drinks   Drug use: No     Allergies   Sulfa antibiotics   Review of Systems Review of Systems  Constitutional: Positive for fatigue.  HENT: Negative for congestion.   Eyes: Negative for visual disturbance.  Respiratory: Negative for cough and shortness of breath.   Cardiovascular: Negative for chest pain.  Gastrointestinal: Positive for abdominal pain.  Genitourinary: Positive for dysuria. Negative for  scrotal swelling and testicular pain.  Musculoskeletal: Negative for back pain.  Skin: Negative for color change and rash.  Neurological: Positive for weakness. Negative for numbness and headaches.  All other systems reviewed and are negative.    Physical Exam Updated Vital Signs BP 109/75    Pulse 73    Temp 98 F (36.7 C) (Oral)    Resp 18    Ht 6\' 2"  (1.88 m)    Wt 86.2 kg    SpO2 98%    BMI 24.39 kg/m   Physical Exam Vitals signs and nursing note reviewed.  Constitutional:      Appearance: He is well-developed. He is not ill-appearing.  HENT:     Head: Normocephalic and atraumatic.  Eyes:     Conjunctiva/sclera: Conjunctivae normal.  Neck:     Musculoskeletal: Normal range of motion and neck supple. No neck rigidity.  Cardiovascular:     Rate  and Rhythm: Normal rate and regular rhythm.     Heart sounds: Murmur present.     Comments: 2+ PT pulses bilaterally. Pulmonary:     Effort: Pulmonary effort is normal. No respiratory distress.     Breath sounds: Normal breath sounds.  Abdominal:     General: Abdomen is flat. Bowel sounds are normal. There is no distension.     Palpations: Abdomen is soft.     Tenderness: There is abdominal tenderness. There is no right CVA tenderness, left CVA tenderness or guarding.     Comments: Tenderness to palpation bilateral lower quadrants, right worse than left.  There is no significant suprapubic or periumbilical tenderness.  Musculoskeletal:     Right lower leg: No edema.     Left lower leg: No edema.  Skin:    General: Skin is warm and dry.     Comments: Feels warm to touch.  Slight diaphoresis on forehead only.  Neurological:     Mental Status: He is alert. Mental status is at baseline.     Comments: At baseline according to mother however appears more tired.  He is an awake and alert.    He is able to answer simple questions without difficulty.  He moves all 4 extremities spontaneously.  There is no facial droop.  His speech is not  slurred.  Psychiatric:        Mood and Affect: Mood normal.        Behavior: Behavior normal.      ED Treatments / Results  Labs (all labs ordered are listed, but only abnormal results are displayed) Labs Reviewed  URINALYSIS, ROUTINE W REFLEX MICROSCOPIC - Abnormal; Notable for the following components:      Result Value   APPearance HAZY (*)    Protein, ur 100 (*)    Nitrite POSITIVE (*)    All other components within normal limits  URINALYSIS, MICROSCOPIC (REFLEX) - Abnormal; Notable for the following components:   Bacteria, UA MANY (*)    All other components within normal limits  COMPREHENSIVE METABOLIC PANEL - Abnormal; Notable for the following components:   Total Bilirubin 1.6 (*)    All other components within normal limits  URINE CULTURE  LIPASE, BLOOD  CBC WITH DIFFERENTIAL/PLATELET  LACTIC ACID, PLASMA  LACTIC ACID, PLASMA    EKG EKG Interpretation  Date/Time:  Monday August 24 2019 15:39:57 EDT Ventricular Rate:  65 PR Interval:    QRS Duration: 90 QT Interval:  395 QTC Calculation: 411 R Axis:   -21 Text Interpretation:  Sinus rhythm Borderline left axis deviation Borderline T abnormalities, inferior leads Minimal ST elevation, anterior leads When compared to prior, ECG similar to 2005 ECG. No STEMI Confirmed by Theda Belfastegeler, Chris ([id number]) on 08/24/2019 5:04:28 PM   Radiology Ct Abdomen Pelvis Wo Contrast  Result Date: 08/24/2019 CLINICAL DATA:  Acute generalized abdominal pain, fell walking to the bathroom, now complaining of abdominal pain, history of autism EXAM: CT ABDOMEN AND PELVIS WITHOUT CONTRAST TECHNIQUE: Multidetector CT imaging of the abdomen and pelvis was performed following the standard protocol without IV contrast. Sagittal and coronal MPR images reconstructed from axial data set. No oral contrast administered. Scattered beam hardening artifacts from the RIGHT secondary to extravasation of IV contrast into the RIGHT upper extremity. This  appears to be the majority of the administered contrast dose (100 ml Omnipaque-300), with no significant contrast seen within the abdominal or pelvic organs. Routine instructions given to the patient's mother. COMPARISON:  12/07/2010 FINDINGS:  Lower chest: Lung bases clear Hepatobiliary: Scattered artifacts. Gallbladder and liver otherwise grossly unremarkable Pancreas: Suboptimally visualized due to artifacts. No gross abnormality seen. Spleen: Scattered artifacts, no gross abnormality seen. Adrenals/Urinary Tract: Scattered artifacts at adrenal glands and kidneys. No gross renal or adrenal masses. No urinary tract calcification or dilatation. Bladder unremarkable. Stomach/Bowel: Normal appendix. Stomach and bowel loops grossly unremarkable. Vascular/Lymphatic: Aorta normal caliber.  No adenopathy. Reproductive: Unremarkable prostate gland Other: No free air free fluid.  No hernia. Musculoskeletal: Well corticated ossicle identified at RIGHT L5-S1 anomalous articulation. No acute osseous findings. IMPRESSION: No acute intra-abdominal or intrapelvic abnormalities. Extravasated IV contrast dose into the RIGHT upper extremity at injection site, the majority of the administered IV contrast dose of 100 mL. Electronically Signed   By: Ulyses Southward M.D.   On: 08/24/2019 17:55   Dg Chest 2 View  Result Date: 08/24/2019 CLINICAL DATA:  Weakness, fall, abdominal pain, history of ASD, pulmonary stenosis EXAM: CHEST - 2 VIEW COMPARISON:  11/25/2012 FINDINGS: The heart is normal in size. There is an Amplatzer type septal occlusion device. Both lungs are clear. The visualized skeletal structures are unremarkable. IMPRESSION: No acute abnormality of the lungs. Electronically Signed   By: Lauralyn Primes M.D.   On: 08/24/2019 15:26    Procedures Procedures (including critical care time)  Medications Ordered in ED Medications  sodium chloride 0.9 % bolus 1,000 mL (0 mLs Intravenous Stopped 08/24/19 1633)  morphine 2 MG/ML  injection 2 mg (2 mg Intravenous Given 08/24/19 1558)  iohexol (OMNIPAQUE) 300 MG/ML solution 100 mL (100 mLs Intravenous Contrast Given 08/24/19 1713)  cephALEXin (KEFLEX) capsule 500 mg (500 mg Oral Given 08/24/19 2116)     Initial Impression / Assessment and Plan / ED Course  I have reviewed the triage vital signs and the nursing notes.  Pertinent labs & imaging results that were available during my care of the patient were reviewed by me and considered in my medical decision making (see chart for details).  Clinical Course as of Oct 20 0001  Mon Aug 24, 2019  1734 Radiology reports that all of patient's IV contrast extravasated into his arm.   [EH]  1747 I went to assess patient.  He has a swelling in his left arm.  RN to measure.  I attempted to pull back on the IV and was unable to pull back.  He reportedly got morphine and a liter of saline earlier with out difficulty.    [EH]  1754 Attempted to call radiology x3 with out success.     [EH]  1815 Spoke with CT tech, they report that they spoke with the radiologist who read patient's CT scan.  Recommendation is for elevation of the arm, observe for 2 to 4 hours with repeat checks, and ice 20 minutes on 4 times a day.   [EH]    Clinical Course User Index [EH] Cristina Gong, PA-C      Patient is a 33 year old man with a medical history of autism, intellectual disability, who presents today for evaluation after a episode of collapse without syncope.  He reported abdominal pain after this.   Labs were obtained and reviewed, CBC and CMP without significant hematologic or electrolyte derangements.  Lipase is not elevated.  Lactic acid is 1.1, not elevated.  UA shows many bacteria with 11-20 white cells and only 0-5 squamous epithelial cells. Denied any testicular or penile pain.  After discussions with the patient's mother, who is his legal guardian, CT scan  was ordered to evaluate for other causes of his pain.  After the CT  scan I received a call that the contrast had extravasated and that all of the contrast was in his right arm.  Radiology was contacted who recommended observation for 2 hours, elevation, and ice. Arm was reevaluated multiple times without change in condition, he remained neurovascularly intact while in the emergency room.   Urine sent for culture.  He is not sexually active. He was treated with 1 L of IV fluids, and 2 mg of morphine while in the emergency room.  He was given Keflex for his UTI.   He does not meet Sirs/sepsis criteria. EKG without evidence of acute abnormalities.  Chest x-ray is unremarkable.     He did not have a full syncopal event rather had this episode after he attempted to use the bathroom, so I suspect that that this was a pain reaction rather than a cardiac event.  He denies any chest pain or shortness of breath.  This patient was seen as a shared visit with Dr. Rush Landmark.   Return precautions were discussed with the parent/patient who states their understanding.  At the time of discharge parent/patient denied any unaddressed complaints or concerns.  Parent/patient is agreeable for discharge home.   Final Clinical Impressions(s) / ED Diagnoses   Final diagnoses:  Lower urinary tract infectious disease  Adverse effect of contrast media, initial encounter    ED Discharge Orders         Ordered    phenazopyridine (PYRIDIUM) 200 MG tablet  3 times daily with meals     08/24/19 2110    cephALEXin (KEFLEX) 500 MG capsule  4 times daily     08/24/19 2110           Cristina Gong, [place] 08/25/19 0006    Tegeler, Canary Brim, MD 08/25/19 1343

## 2019-08-24 NOTE — Discharge Instructions (Addendum)
Today your urine showed concern for a urinary tract infection.  I have sent a prescription for Keflex, an antibiotic, to your preferred pharmacy. You may also take Pyridium to help with urinary discomfort.  This will turn your urine bright orange.  Please continue to monitor your IV site where the contrast was injected. Please make sure you are elevating your arm above your heart frequently.  Please do ice packs 4 times a day for the next 2 days and then after that switch to warm packs 4 times a day until your symptoms have resolved.  I would recommend seeing your primary care doctor in 2 days for recheck on your arm and recheck for your urinary tract infection.  If your arm worsens, or you have any concerns please go to [facility] emergency room in [place].  If you develop fevers, vomiting or other concerns please seek additional medical care.  You may have diarrhea from the antibiotics.  It is very important that you continue to take the antibiotics even if you get diarrhea unless a medical professional tells you that you may stop taking them.  If you stop too early the bacteria you are being treated for will become stronger and you may need different, more powerful antibiotics that have more side effects and worsening diarrhea.  Please stay well hydrated and consider probiotics as they may decrease the severity of your diarrhea.   Please take Tylenol (acetaminophen) to relieve your pain.  You may take tylenol, up to 1,000 mg (two extra strength pills).  Do not take more than 3,000 mg tylenol in a 24 hour period.  Please check all medication labels as many medications such as pain and cold medications may contain tylenol. Please do not drink alcohol while taking this medication.

## 2019-08-24 NOTE — ED Triage Notes (Signed)
He was walking to the bathroom and fell. He lives in a group home. When the staff picked him up he c.o abdominal pain. Hx of autism.

## 2019-08-24 NOTE — ED Notes (Signed)
ED Provider at bedside. 

## 2019-08-24 NOTE — ED Notes (Signed)
Pt on monitor 

## 2019-08-25 ENCOUNTER — Other Ambulatory Visit: Payer: Self-pay

## 2019-08-25 ENCOUNTER — Encounter: Payer: Self-pay | Admitting: Family Medicine

## 2019-08-25 MED ORDER — ASPIRIN 81 MG PO TBEC: 81.0000 mg | DELAYED_RELEASE_TABLET | Freq: Every morning | ORAL | 11 refills | Status: DC

## 2019-08-25 NOTE — Progress Notes (Signed)
Dr. Aundra Dubin,  I was refilling this patient's chronic aspirin and noted that they seem to have lost contact with you after their visit in 2018.  In that visit it said there was a potential for a follow-up MRA chest to follow a pulmonary artery aneurysm, is this something that we need to try and follow-up with them to get accomplished and get them back to see you?  Dr. Criss Rosales

## 2019-08-26 ENCOUNTER — Telehealth: Payer: Self-pay | Admitting: Family Medicine

## 2019-08-26 DIAGNOSIS — I281 Aneurysm of pulmonary artery: Secondary | ICD-10-CM

## 2019-08-26 LAB — URINE CULTURE: Culture: >=100000 — AB

## 2019-08-26 NOTE — Progress Notes (Signed)
Yes, ideally he would get a followup MRA chest to follow the pulmonary artery and I could see back after that.

## 2019-08-26 NOTE — Telephone Encounter (Signed)
Please call patient, " Dr. Criss Rosales was looking through your chart and he saw that you were supposed to get a follow-up picture of your chest called  an MRA as part of your routine follow-up with Dr. Aundra Dubin the heart doctor.  I went ahead and placed an order for this so that our nurses can help you schedule it.  Once you get your appointment for this, go ahead and call Dr. Aundra Dubin and arrange for a follow-up with him at least 3 to 4 days after you get your picture taken."  Dr. Criss Rosales

## 2019-08-27 ENCOUNTER — Telehealth (HOSPITAL_BASED_OUTPATIENT_CLINIC_OR_DEPARTMENT_OTHER): Payer: Self-pay

## 2019-08-27 NOTE — Telephone Encounter (Signed)
Post ED Visit - Positive Culture Follow-up: Chart Hand-off to ED Flow Manager  Culture assessed and recommendations reviewed by: []  Levester Fresh, Pharm.D., BCPS []  Heide Guile, Pharm.D., BCPS-AQ ID []  Alycia Rossetti, Pharm.D., BCPS []  [place], [place].D., BCPS, AAHIVP []  Legrand Como, Pharm .D., BCPS, AAHIVP []  Milus Glazier, Pharm.D. []  Dimitri Ped, Pharm.D. X Mimi Pham, Pharm D  Positive urine culture >/= 100,000 colonies -> Morganella Morganii  []  Patient discharged without antimicrobial prescription and treatment is now indicated [x]  Organism is resistant to prescribed ED discharge antimicrobial (Cephalexin) []  Patient with positive blood cultures  Changes discussed with ED provider: Lenn Sink PA  New antibiotic prescription Stop Cephalexin "Start Ciprofloxacin 500 mg by mouth every 12 hours for 7 days"  10/22 @ 1024 LVM on Mothers home phone # requesting call back.  Called mothers work phone number w/no answer.  Dortha Kern 08/27/2019, 10:24 AM

## 2019-08-27 NOTE — Telephone Encounter (Signed)
08/27/19 @ 70 Mother returned call, informed of sons need for abx change.  Rx for "Cipro 500 mg by mouth every 12 hours for 7 days" called in to [facility] [phone].  Rx given to pharmacy staff.  Mother instructed to have pt stop taking Cephalexin.

## 2019-08-27 NOTE — Telephone Encounter (Signed)
Called and spoke to mom. Mom approved to schedule MRA. Called GI, they will call mom and schedule MRA. Told mom to call Dr. Claris Gladden office and schedule an appt 3 to 4 days after the MRA. Mom had good understanding.  Ottis Stain, CMA

## 2019-08-27 NOTE — Progress Notes (Signed)
ED Antimicrobial Stewardship Positive Culture Follow Up   [name] is an 33 y.o. male who [facility] with a chief complaint of  Chief Complaint  Patient presents with  . Fall    Recent Results (from the past 720 hour(s))  Urine culture     Status: Abnormal   Collection Time: 08/24/19  3:07 PM   Specimen: Urine, Clean Catch  Result Value Ref Range Status   Specimen Description   Final    URINE, CLEAN CATCH Performed at [facility], [address] [place], [place] [address]    Special Requests   Final    NONE Performed at [facility], [address] [place], [place] [address]    Culture >=100,000 COLONIES/mL [facility] MORGANII (A)  Final   Report Status 08/26/2019 FINAL  Final   Organism ID, Bacteria MORGANELLA MORGANII (A)  Final      Susceptibility   Morganella morganii - MIC*    AMPICILLIN >=32 RESISTANT Resistant     CEFAZOLIN >=64 RESISTANT Resistant     CEFTRIAXONE <=1 SENSITIVE Sensitive     CIPROFLOXACIN <=0.25 SENSITIVE Sensitive     GENTAMICIN <=1 SENSITIVE Sensitive     IMIPENEM 2 SENSITIVE Sensitive     NITROFURANTOIN RESISTANT Resistant     TRIMETH/SULFA <=20 SENSITIVE Sensitive     AMPICILLIN/SULBACTAM >=32 RESISTANT Resistant     PIP/TAZO <=4 SENSITIVE Sensitive     * >=100,000 COLONIES/mL MORGANELLA MORGANII    [x]  Treated with cephalexin, organism resistant to prescribed antimicrobial.  New antibiotic prescription: Stop cephalexin (Keflex). Start ciprofloxacin (Cipro) 500 mg tablets, take 1 tablet (500 mg) by mouth every 12 hours for 7 days.  ED Provider: Lenn Sink, PA-C   Kysean Sweet Amada Kingfisher 08/27/2019, 9:21 AM Clinical Pharmacist Monday - Friday phone -  [phone] Saturday - Sunday phone - [phone]

## 2019-09-01 ENCOUNTER — Other Ambulatory Visit: Payer: Self-pay | Admitting: Family Medicine

## 2019-09-01 ENCOUNTER — Telehealth: Payer: Self-pay | Admitting: Family Medicine

## 2019-09-01 NOTE — Telephone Encounter (Signed)
Please call patient, "Dr. Criss Rosales wanted [place] to call you and let you know that he wanted to hold off on refilling the montelukast medication.  He would like you to call and and have a discussion with one of the doctors about this medicine as there can be some mental health impact with this medicine so he is generally not refilling this unless someone absolutely requires this for their breathing.  Dr. Criss Rosales is out of town for the next month and so you can have this telemedicine conversation with any one of the other doctors or you can wait for him to return and discuss it with him personally "  Dr. Criss Rosales

## 2019-09-02 ENCOUNTER — Inpatient Hospital Stay: Payer: Medicaid Other | Admitting: Family Medicine

## 2019-09-02 NOTE — Telephone Encounter (Signed)
Pt has an appt with Dr. Kris Mouton on 11/3. Ottis Stain, CMA

## 2019-09-08 ENCOUNTER — Ambulatory Visit (INDEPENDENT_AMBULATORY_CARE_PROVIDER_SITE_OTHER): Payer: Medicaid Other | Admitting: Family Medicine

## 2019-09-08 ENCOUNTER — Encounter: Payer: Self-pay | Admitting: Family Medicine

## 2019-09-08 ENCOUNTER — Other Ambulatory Visit: Payer: Self-pay

## 2019-09-08 VITALS — BP 94/62 | HR 72 | Wt 196.0 lb

## 2019-09-08 DIAGNOSIS — N390 Urinary tract infection, site not specified: Secondary | ICD-10-CM | POA: Diagnosis present

## 2019-09-08 DIAGNOSIS — J302 Other seasonal allergic rhinitis: Secondary | ICD-10-CM | POA: Diagnosis not present

## 2019-09-08 MED ORDER — MONTELUKAST SODIUM 10 MG PO TABS: 10.0000 mg | ORAL_TABLET | Freq: Every day | ORAL | 0 refills | Status: DC

## 2019-09-08 NOTE — Patient Instructions (Signed)
It was great meeting Kia today!  I think it is a good idea to have a urology referral to have a specialist work-up is recurrent UTI.  I am glad he is doing a lot better from that standpoint.  I am going to go ahead and refill of Singulair as I do not see much reason in withholding the medication.

## 2019-09-13 DIAGNOSIS — N390 Urinary tract infection, site not specified: Secondary | ICD-10-CM | POA: Insufficient documentation

## 2019-09-13 NOTE — Assessment & Plan Note (Addendum)
Patient with second UTI in 5 years.  His symptoms have completely resolved .  Initially it was resistant to Keflex, but this was switched to another antibiotic by the ED.  Unclear which antibiotic this was there is no documentation detailing this.  Will refer to urology for further assessment given the recurrent nature of UTIs.  Unlikely to be anatomic abnormality given the late onset of his UTIs, not sexually active.  Unclear if dehydration is playing a role as his caregiver says he only urinates once or twice per day.  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

## 2019-09-13 NOTE — Assessment & Plan Note (Signed)
Currently taking Zyrtec, Flonase, Singulair.  Some concern about blackbox warning of mood disorder with the Singulair.  Given the patient has been stable on this medication for many years and he is getting tremendous benefit I will refill his medication.  Symptoms unlikely to manifest after years and years. -Continue Zyrtec 10 mg daily -Continue Singulair 10 mg daily -Continue Flonase 2 puffs each nostril twice daily

## 2019-09-13 NOTE — Progress Notes (Signed)
   HPI 33 year old male who presents with his group home caregiver for hospital follow-up and allergy management.  Patient was seen at [facility] ED on 08/24/2019.  Is having dysuria type symptoms.  Urine culture grew out 100,000 colony-forming units of E. coli.  Is initially given Keflex, but there was resistance is antibiotics this was switched to "another antibiotic" once his culture came back.  His caregiver is not sure what they switch it to and there is no documentation of the switch.  Fortunately the patient is feeling much better and his symptoms have resolved.  This is the second UTI he has had within the last 5 years.  His caregiver is interested in further work-up for this.  He is not sexually active, never had a UTI before 5 years ago, and as far as his caregiver notes does not have any anatomic abnormalities.  She feels like he does not hydrate well he will often only urinate 1 or 2 times per day.  Patient also presents for Singulair refill.  He has been on Singulair for "many many years".  His current regimen of Zyrtec, Singulair, and Flonase has worked really well and he has not had allergy symptoms in a few years now.  He presents for refill of Singulair.  CC: Allergy management, UTI follow-up   ROS:   Review of Systems See HPI for ROS.   CC, SH/smoking status, and VS noted  Objective: BP 94/62   Pulse 72   Wt 196 lb (88.9 kg)   SpO2 97%   BMI 25.16 kg/m  Gen: Very pleasant 33 year old African-American male, no acute distress CV: Regular rate and rhythm, no M/R/G Resp: Lungs clear to auscultation bilaterally, no accessory muscle use Neuro: Alert and oriented, Speech clear, No gross deficits   Assessment and plan:  Seasonal allergies Currently taking Zyrtec, Flonase, Singulair.  Some concern about blackbox warning of mood disorder with the Singulair.  Given the patient has been stable on this medication for many years and he is getting tremendous benefit I will refill  his medication.  Symptoms unlikely to manifest after years and years. -Continue Zyrtec 10 mg daily -Continue Singulair 10 mg daily -Continue Flonase 2 puffs each nostril twice daily   Orders Placed This Encounter  Procedures  . Ambulatory referral to Urology    Referral Priority:   Routine    Referral Type:   Consultation    Referral Reason:   Specialty Services Required    Requested Specialty:   Urology    Number of Visits Requested:   1    Meds ordered this encounter  Medications  . DISCONTD: montelukast (SINGULAIR) 10 MG tablet    Sig: Take 1 tablet (10 mg total) by mouth at bedtime.    Dispense:  90 tablet    Refill:  0  . montelukast (SINGULAIR) 10 MG tablet    Sig: Take 1 tablet (10 mg total) by mouth at bedtime.    Dispense:  180 tablet    Refill:  0    Guadalupe Dawn MD PGY-3 Family Medicine Resident  09/13/2019 6:35 PM

## 2019-09-21 ENCOUNTER — Other Ambulatory Visit: Payer: Self-pay

## 2019-09-21 DIAGNOSIS — Z20822 Contact with and (suspected) exposure to covid-19: Secondary | ICD-10-CM

## 2019-09-23 ENCOUNTER — Other Ambulatory Visit: Payer: Self-pay | Admitting: *Deleted

## 2019-09-23 DIAGNOSIS — L219 Seborrheic dermatitis, unspecified: Secondary | ICD-10-CM

## 2019-09-23 LAB — NOVEL CORONAVIRUS, NAA: SARS-CoV-2, NAA: NOT DETECTED

## 2019-09-23 MED ORDER — FLUTICASONE PROPIONATE 50 MCG/ACT NA SUSP: 2.0000 | Freq: Every day | NASAL | 11 refills | Status: DC

## 2019-09-23 MED ORDER — KETOCONAZOLE 2 % EX SHAM: MEDICATED_SHAMPOO | CUTANEOUS | 11 refills | Status: DC

## 2019-09-29 ENCOUNTER — Other Ambulatory Visit: Payer: Self-pay

## 2019-09-29 ENCOUNTER — Ambulatory Visit
Admission: RE | Admit: 2019-09-29 | Discharge: 2019-09-29 | Disposition: A | Discharge status: Home / Self Care | Payer: Medicaid Other | Source: Ambulatory Visit | Attending: Family Medicine | Admitting: Family Medicine

## 2019-09-29 DIAGNOSIS — I281 Aneurysm of pulmonary artery: Secondary | ICD-10-CM

## 2019-09-29 MED ORDER — GADOBENATE DIMEGLUMINE 529 MG/ML IV SOLN
18.0000 mL | Freq: Once | INTRAVENOUS | Status: AC | PRN
  Administered 2019-09-29: 18 mL via INTRAVENOUS

## 2019-10-05 ENCOUNTER — Other Ambulatory Visit: Payer: Self-pay

## 2019-10-05 DIAGNOSIS — Z20822 Contact with and (suspected) exposure to covid-19: Secondary | ICD-10-CM

## 2019-10-06 LAB — NOVEL CORONAVIRUS, NAA: SARS-CoV-2, NAA: NOT DETECTED

## 2019-10-23 ENCOUNTER — Ambulatory Visit: Payer: Medicaid Other | Attending: Internal Medicine

## 2019-10-23 DIAGNOSIS — U071 COVID-19: Secondary | ICD-10-CM

## 2019-10-24 LAB — NOVEL CORONAVIRUS, NAA: SARS-CoV-2, NAA: NOT DETECTED

## 2019-10-27 ENCOUNTER — Other Ambulatory Visit: Payer: Self-pay | Admitting: Family Medicine

## 2019-10-27 DIAGNOSIS — L732 Hidradenitis suppurativa: Secondary | ICD-10-CM

## 2019-11-02 ENCOUNTER — Ambulatory Visit: Payer: Medicaid Other | Attending: Internal Medicine

## 2019-11-02 DIAGNOSIS — Z20822 Contact with and (suspected) exposure to covid-19: Secondary | ICD-10-CM

## 2019-11-04 LAB — NOVEL CORONAVIRUS, NAA: SARS-CoV-2, NAA: NOT DETECTED

## 2019-11-10 ENCOUNTER — Ambulatory Visit: Payer: Medicaid Other | Attending: Internal Medicine

## 2019-11-10 DIAGNOSIS — Z20822 Contact with and (suspected) exposure to covid-19: Secondary | ICD-10-CM

## 2019-11-12 LAB — NOVEL CORONAVIRUS, NAA: SARS-CoV-2, NAA: NOT DETECTED

## 2019-11-23 ENCOUNTER — Other Ambulatory Visit: Payer: Self-pay | Admitting: Family Medicine

## 2019-11-23 DIAGNOSIS — L732 Hidradenitis suppurativa: Secondary | ICD-10-CM

## 2019-12-21 ENCOUNTER — Other Ambulatory Visit: Payer: Self-pay | Admitting: Family Medicine

## 2019-12-21 DIAGNOSIS — L732 Hidradenitis suppurativa: Secondary | ICD-10-CM

## 2020-03-02 ENCOUNTER — Other Ambulatory Visit: Payer: Self-pay | Admitting: Family Medicine

## 2020-04-28 ENCOUNTER — Other Ambulatory Visit: Payer: Self-pay

## 2020-04-28 ENCOUNTER — Ambulatory Visit (INDEPENDENT_AMBULATORY_CARE_PROVIDER_SITE_OTHER): Payer: Medicaid Other | Admitting: Family Medicine

## 2020-04-28 ENCOUNTER — Encounter: Payer: Self-pay | Admitting: Family Medicine

## 2020-04-28 VITALS — BP 110/84 | HR 72 | Ht 74.0 in | Wt 215.6 lb

## 2020-04-28 DIAGNOSIS — R2689 Other abnormalities of gait and mobility: Secondary | ICD-10-CM | POA: Diagnosis not present

## 2020-04-28 DIAGNOSIS — N390 Urinary tract infection, site not specified: Secondary | ICD-10-CM

## 2020-04-28 DIAGNOSIS — Z593 Problems related to living in residential institution: Secondary | ICD-10-CM | POA: Diagnosis not present

## 2020-04-28 NOTE — Assessment & Plan Note (Signed)
Caretaker comes to the patient today to get his annual [place] 2 form filled out.  He has had no significant medication changes.  He has been following up with urology appropriately although they are concerned that they feel there should be more work-up done so they will check in with them again.  Will also be referred to sports medicine for gait evaluation and consideration for either further PT or orthotics.

## 2020-04-28 NOTE — Assessment & Plan Note (Signed)
Patient is now walking 2 to 3 miles per day with his caretaker at the group home.  He has been to PT before and has the physical ability to walk on his heels if he is reminded.  The caretaker with him today is concerned that he has been wearing out socks underneath the toes very fast.  He does have a steady gait as he walked around the office but is definitely toe walking with slight interning of the left foot which they say is chronic.  He says it does not hurt to walk and he feels comfortable.  Due to caretakers concern we can refer back to sports therapy to see if there is any orthotic that might help encourage heel walking as it is hard for him to remember to do so.

## 2020-04-28 NOTE — Patient Instructions (Signed)
Today we filled out [employer] [place] 2 form.  I am going to scan one and put it in the electronic record here as well as giving you back the original.  I agree with you that he seems to be doing very well and I am that he still happy at the group home, I do want to get you some extra resources to potentially help out with his toe walking.  Going to send in a referral to the sports medicine office where they might be able to come up with some ideas or some physical therapy exercises that might be helpful.  They also can make orthotic shoe inserts if they think that that will be useful to him.  As we discussed I will be leaving the practice at the end of the month, he will be assigned to another physician here and you should be able to reach out to [place] at any time if you need anything.  Dr. Parke Simmers

## 2020-04-28 NOTE — Assessment & Plan Note (Signed)
Was appropriately referred to urology, went to see them and caretaker is under the impression that they prescribed an antibiotic and gave no other guidance.  I advised him to check back out with urology and confirmed that that is what they want to do because I do not have his ability to those charts.  I advised that if they do not feel they were satisfied with the evaluation communication that we could offer a second referral if they wanted [place] to.  No symptoms claimed in the office today

## 2020-04-28 NOTE — Progress Notes (Signed)
SUBJECTIVE:   CHIEF COMPLAINT / HPI:   Habitual toe-walking Patient is now walking 2 to 3 miles per day with his caretaker at the group home.  He has been to PT before and has the physical ability to walk on his heels if he is reminded.  The caretaker with him today is concerned that he has been wearing out socks underneath the toes very fast.  He does have a steady gait as he walked around the office but is definitely toe walking with slight interning of the left foot which they say is chronic.  He says it does not hurt to walk and he feels comfortable.  Due to caretakers concern we can refer back to sports therapy to see if there is any orthotic that might help encourage heel walking as it is hard for him to remember to do so.  Recurrent UTI Was appropriately referred to urology, went to see them and caretaker is under the impression that they prescribed an antibiotic and gave no other guidance.  I advised him to check back out with urology and confirmed that that is what they want to do because I do not have his ability to those charts.  I advised that if they do not feel they were satisfied with the evaluation communication that we could offer a second referral if they wanted [place] to.  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  Does have chronically stuttering gait but appears stable while walking  and has not had history of falls  ASSESSMENT/PLAN:   Habitual toe-walking Patient is now walking 2 to 3 miles per day with his caretaker at the group home.  He has been to PT before and has the physical ability to walk on his heels if he is reminded.  The caretaker with him today is concerned that he has been wearing out socks underneath the toes very fast.  He does have a steady gait as he walked around the office but is definitely toe walking with slight interning of the left foot which they say is chronic.  He says it does not hurt to walk and he feels comfortable.  Due to caretakers concern we can refer back to sports therapy to see if there is any orthotic that might help encourage heel walking as it is hard for him to remember to do so.  Recurrent UTI Was appropriately referred to urology, went to see them and caretaker is under the impression that they prescribed an antibiotic and gave no other guidance.  I advised him to check back out with urology and confirmed that that is what they want to do because I do not have his ability to those charts.  I advised that if they do not feel they were satisfied with the evaluation communication that we could offer a  second referral if they wanted [place] to.  No symptoms claimed in the office today     Marthenia Rolling, DO [facility]

## 2020-04-29 ENCOUNTER — Encounter: Payer: Self-pay | Admitting: Family Medicine

## 2020-05-02 ENCOUNTER — Encounter: Payer: Self-pay | Admitting: Family Medicine

## 2020-05-02 ENCOUNTER — Other Ambulatory Visit: Payer: Self-pay

## 2020-05-02 ENCOUNTER — Ambulatory Visit: Payer: Medicaid Other | Admitting: Family Medicine

## 2020-05-02 DIAGNOSIS — R2689 Other abnormalities of gait and mobility: Secondary | ICD-10-CM

## 2020-05-02 NOTE — Patient Instructions (Signed)
Nice to meet you Please try soaking his feet and rubbing down the callous with a pumice stone.   Please send me a message in MyChart with any questions or updates.  Please see [place] back as needed.   --Dr. Jordan Likes

## 2020-05-02 NOTE — Progress Notes (Signed)
[name] - 34 y.o. male MRN [id number]  Date of birth: August 27, 1986  SUBJECTIVE:  Including CC & ROS.  No chief complaint on file.   [name] is a 34 y.o. male that is presenting with concern of his toe walking.  He has done this since he has been able to walk.  He has been in physical therapy when he was much younger.  He is active in [place] and is able to participate in sports with no concerns.  He does develop calluses on his feet.   Review of Systems See HPI   HISTORY: Past Medical, Surgical, Social, and Family History Reviewed & Updated per EMR.   Pertinent Historical Findings include:  Past Medical History:  Diagnosis Date  . ASD (atrial septal defect)    a. 11/2012 ASD closure of ostium secundum ASD w/ 91mm Amplatzer septal occluder device;  b. 11/2012 Echo: EF 55-60%, mild MR, Triv PS, ASD closure device in place.  . Autism disorder   . History of echocardiogram    Echo 11/16: EF 55-60%, no RWMA, Gr 2 DD, mild MR, s/p ASD closure with no evidence of shunt, trivial TR, mild Pulmonic stenosis, trivial PI // Echo 11/17: EF 55-60, prominent trabeculation toward the apex, normal wall motion, grade 2 diastolic dysfunction, normal RVSF, mild RAE, ASD closure device well-seated without evidence for leakage, peak RV-RA gradient 27, mild PS (peak 9), PASP 30   . Mental retardation   . Pulmonary artery stenosis, main     Past Surgical History:  Procedure Laterality Date  . ASD REPAIR N/A 11/24/2012   Procedure: ATRIAL SEPTAL DEFECT (ASD) REPAIR;  Surgeon: Sherren Mocha, MD;  Location: [facility] CATH LAB;  Service: Cardiovascular;  Laterality: N/A;  . CARDIAC SURGERY    . TEE WITHOUT CARDIOVERSION  10/23/2012   Procedure: TRANSESOPHAGEAL ECHOCARDIOGRAM (TEE);  Surgeon: Larey Dresser, MD;  Location: [facility];  Service: Cardiovascular;  Laterality: N/A;  Dr. requesting Scheryl Darter. come over to push propofol/ pt is hard to sedate  . TONSILLECTOMY    . TRANSESOPHAGEAL  ECHOCARDIOGRAM W/O CARDIOVERSION N/A 11/24/2012   Procedure: TRANSESOPHAGEAL ECHOCARDIOGRAM W/O CARDIOVERSION;  Surgeon: Sherren Mocha, MD;  Location[facility] CATH LAB;  Service: Cardiovascular;  Laterality: N/A;    Family History  Problem Relation Age of Onset  . Hypertension Mother   . Diabetes Mother   . Heart attack Neg Hx   . Stroke Neg Hx     Social History   Socioeconomic History  . Marital status: Single    Spouse name: Not on file  . Number of children: Not on file  . Years of education: Not on file  . Highest education level: Not on file  Occupational History  . Not on file  Tobacco Use  . Smoking status: Never Smoker  . Smokeless tobacco: Never Used  Vaping Use  . Vaping Use: Never used  Substance and Sexual Activity  . Alcohol use: No    Alcohol/week: 0.0 standard drinks  . Drug use: No  . Sexual activity: Never  Other Topics Concern  . Not on file  Social History Narrative   Completed high school with certificate. Goes to day program (but not currently as COVID). M-F: mathemetics, relationships, education.    Social Determinants of Health   Financial Resource Strain:   . Difficulty of Paying Living Expenses:   Food Insecurity:   . Worried About Charity fundraiser in the Last Year:   . [employer] of  Food in the Last Year:   Transportation Needs:   . Freight forwarder (Medical):   Marland Kitchen Lack of Transportation (Non-Medical):   Physical Activity:   . Days of Exercise per Week:   . Minutes of Exercise per Session:   Stress:   . Feeling of Stress :   Social Connections:   . Frequency of Communication with Friends and Family:   . Frequency of Social Gatherings with Friends and Family:   . Attends Religious Services:   . Active Member of Clubs or Organizations:   . Attends Banker Meetings:   Marland Kitchen Marital Status:   Intimate Partner Violence:   . Fear of Current or Ex-Partner:   . Emotionally Abused:   Marland Kitchen Physically Abused:   . Sexually Abused:        PHYSICAL EXAM:  VS: BP 130/84   Pulse 73   Ht 6\' 1"  (1.854 m)   Wt 215 lb (97.5 kg)   BMI 28.37 kg/m  Physical Exam Gen: NAD, alert, cooperative with exam, well-appearing MSK:  Right and left foot: He will toe off upon ambulation.  No heel landing. Has developed calluses on the plantar forefoot. Hallux valgus more prominent on the left. Pes planus. 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  He has good strength and is able to participate in [employer] with no complications. -Counseled on supportive care. -Counseled on care of the calluses. -Could consider heel lifts or scaphoid pads

## 2020-05-02 NOTE — Assessment & Plan Note (Signed)
Chronic in nature.  He has good strength and is able to participate in [employer] with no complications. -Counseled on supportive care. -Counseled on care of the calluses. -Could consider heel lifts or scaphoid pads

## 2020-08-15 ENCOUNTER — Encounter: Payer: Self-pay | Admitting: Family Medicine

## 2020-08-15 ENCOUNTER — Ambulatory Visit (INDEPENDENT_AMBULATORY_CARE_PROVIDER_SITE_OTHER): Payer: Medicaid Other | Admitting: Family Medicine

## 2020-08-15 ENCOUNTER — Other Ambulatory Visit: Payer: Self-pay

## 2020-08-15 VITALS — BP 92/62 | HR 73 | Wt 215.6 lb

## 2020-08-15 DIAGNOSIS — Z23 Encounter for immunization: Secondary | ICD-10-CM

## 2020-08-15 DIAGNOSIS — Z7185 Encounter for immunization safety counseling: Secondary | ICD-10-CM | POA: Insufficient documentation

## 2020-08-15 NOTE — Progress Notes (Signed)
   Covid-19 Vaccination Clinic  Name:  OSSIE BELTRAN    MRN: [id number] DOB: 12/09/1985  08/15/2020  Mr. Mccanless was observed post Covid-19 immunization for 15 minutes without incident. He was provided with Vaccine Information Sheet and instruction to access the V-Safe system.   Mr. Kissoon was instructed to call 911 with any severe reactions post vaccine: Marland Kitchen Difficulty breathing  . Swelling of face and throat  . A fast heartbeat  . A bad rash all over body  . Dizziness and weakness

## 2020-08-15 NOTE — Assessment & Plan Note (Signed)
Patient is here to discuss the eligibility status for the COVID-19 booster.  We discussed the risks and benefits of the booster and informed him that he is eligible for the booster.  They do not have his vaccine card history with him at this time. -Patients family is going to get the card and call and schedule COVID-19 vaccine booster -Patient receiving flu shot today

## 2020-08-15 NOTE — Progress Notes (Signed)
    SUBJECTIVE:   CHIEF COMPLAINT / HPI:   Questions regarding COVID-19 vaccine booster Patient is here with his brother for the flu shot but they also had questions regarding receiving the COVID-19 vaccine booster.  He received his second dose of the COVID-19 vaccine in February but does not have the card with him.  The patient lives in a long-term care facility due to his intellectual disability.  We discussed that this makes him eligible for the COVID-19 booster as long as it is 6 months after his second dose of the vaccine.  They would like to get the booster today but will come back for booster with the vaccination card to confirm that that is when he received his previous doses.  They have no other questions or concerns at this time.  OBJECTIVE:   BP 92/62   Pulse 73   Wt 215 lb 9.6 oz (97.8 kg)   SpO2 96%   BMI 28.44 kg/m   General: Well-appearing, no acute distress, sitting comfortably on exam table Cardiac: Regular in rhythm, no murmurs appreciated Respiratory:: Normal work of breathing, lungs clear to auscultation bilaterally Abdomen: Soft, nontender, positive bowel sounds Psych: Intellectually disabled, responds to questions appropriately  ASSESSMENT/PLAN:   Vaccine counseling Patient is here to discuss the eligibility status for the COVID-19 booster.  We discussed the risks and benefits of the booster and informed him that he is eligible for the booster.  They do not have his vaccine card history with him at this time. -Patients family is going to get the card and call and schedule COVID-19 vaccine booster -Patient receiving flu shot today     Derrel Nip, MD [facility]

## 2020-08-15 NOTE — Patient Instructions (Signed)
It was a pleasure to see you today.  [name] is eligible for the COVID-19 booster but we required that she bring his vaccination card from his previous 2 vaccines to verify that it has been more than 6 months.  Please schedule a vaccination visit and bring the card to that visit.  He is receiving the flu vaccine today.  If you have any questions, concerns, issues please feel free to call the clinic and schedule an appointment.  I hope you have a wonderful afternoon!

## 2020-12-21 ENCOUNTER — Other Ambulatory Visit: Payer: Self-pay

## 2020-12-21 ENCOUNTER — Ambulatory Visit (INDEPENDENT_AMBULATORY_CARE_PROVIDER_SITE_OTHER): Payer: Medicaid Other | Admitting: Student in an Organized Health Care Education/Training Program

## 2020-12-21 VITALS — BP 128/86 | HR 91 | Temp 99.2°F | Wt 215.0 lb

## 2020-12-21 DIAGNOSIS — J069 Acute upper respiratory infection, unspecified: Secondary | ICD-10-CM

## 2020-12-21 DIAGNOSIS — Z1152 Encounter for screening for COVID-19: Secondary | ICD-10-CM

## 2020-12-21 NOTE — Patient Instructions (Signed)
It was a pleasure to see you today!  To summarize our discussion for this visit:  I'm sorry to hear that you are not feeling well.   Please rest and try some of these remedies below to help you feel better.   Your covid test will be available on mychart later today  Some additional health maintenance measures we should update are: Health Maintenance Due  Topic Date Due  . Hepatitis C Screening  Never done  .    Call the clinic at [phone] if your symptoms worsen or you have any concerns.   Thank you for allowing me to take part in your care,  Dr. Jamelle Rushing  What You Can Do to Feel Better When You Have a Viral Illness Common Symptoms: runny eyes, muscle aches, throat irritation, ear pain, cough, sneezing, runny nose or congested nose, sinus pressure, headache, fever, fatigue  For your cough, try these: . Teaspoon of honey either alone or mixed with warm water . Tessalon pearls . Lozenges or hard candies . Laying with head elevated . Vicks/menthol rub topically on chest  For your congestion, try these:  . Steroid nasal spray such as fluticasone or budesonide- helps prevent swelling in the nasal passage . Guaifenesin (mucinex) - helps thin the mucus . Steam- in a closed bathroom with hot shower running or a bedside humidifier . Drinking plenty of fluids to stay hydrated can help thin mucus  For your runny nose:  . Atrovent nasal spray- helps decrease the drainage (can lead to dryness if overdone) . Nasal rinses such as a netty pot or a bulb syringe using filtered water mixed with small amount of baking soda and/or sea salt  For your fever:  . Acetaminophen (Tylenol) up to 4g per day for most people . Ibuprofen 600mg  up to three times per day for most people  For your sore throat: . Drinking either warm or cold liquids (whichever feels best to you) . Gargling warm salt water   Help prevent spreading of infection to others. your hands  frequently . Avoid crowded places . Wear a mask when in public . Get your regularly scheduled vaccinations as they are recommended by the CDC.  Fun facts: -Antibiotics treat bacteria and have no effect on viruses so are not helpful in the vast majority of upper respiratory illnesses which are caused by common cold or flu viruses.  -Generic over the counter (OTC) medications have the same active ingredients and effectiveness of the more expensive name-brand version. -Vaccines are available to prevent infection with several of the most infectious/deadly viruses.

## 2020-12-21 NOTE — Progress Notes (Unsigned)
   SUBJECTIVE:   CHIEF COMPLAINT / HPI: headache, congestion?  TJ, who is a resident at a group home, comes in today with his mother who is also sick. Starting yesterday, he developed a headache, nasal congestion and cough with some mild clear phlegm. His nasal discharge is also clear. He took ibuprofen once which did help him feel better somewhat. He regularly takes aspirin and Flonase. Denies diarrhea, belly pain, throat pain, ear pain,  Fully covid vaccinated. Did not get tested.  There was another member of the group home who was sick earlier this week but tested negative for Covid.  OBJECTIVE:   BP 128/86   Pulse 91   Temp 99.2 F (37.3 C)   Wt 215 lb (97.5 kg)   SpO2 98%   BMI 28.37 kg/m   General: NAD, pleasant, able to participate in exam Cardiac: RRR, normal heart sounds, no murmurs. Respiratory: CTAB, normal effort, No wheezes, rales or rhonchi Head: [place]/AT.   Eyes:  EOMI, negative conjunctivitis or drainage Ears:  External ears WNL, Bilateral TM's normal without retraction, redness or bulging. Nose:  Septum midline, moderate clear and green nasal discharge bilaterally with edema to turbinates Mouth:  MMM, tonsils non-erythematous, non-edematous. Neck: Positive cervical lymphadenitis Abdomen: soft, nontender, nondistended Extremities: no edema. WWP. Skin: warm and dry, no rashes noted Neuro: alert  ASSESSMENT/PLAN:   Viral URI History and physical significant for upper respiratory viral illness. Symptoms fit with a Covid diagnosis. Patient swab today for Covid and recommended he isolate until results come back, to his mother Provided patient and his mother with a list of homeopathic remedies for supportive treatment in cases of viral illnesses. I also spent extensive time in the room speaking with him and his mother about different treatments that they could use to lessen their symptoms including drinking plenty of water, resting, Tylenol as needed, nasal sprays,  etc.     Leeroy Bock, DO [facility]

## 2020-12-22 LAB — NOVEL CORONAVIRUS, NAA: SARS-CoV-2, NAA: DETECTED — AB

## 2020-12-22 LAB — SARS-COV-2, NAA 2 DAY TAT

## 2020-12-23 ENCOUNTER — Telehealth: Payer: Self-pay | Admitting: Unknown Physician Specialty

## 2020-12-23 ENCOUNTER — Telehealth: Payer: Self-pay

## 2020-12-23 ENCOUNTER — Other Ambulatory Visit (HOSPITAL_COMMUNITY): Payer: Self-pay | Admitting: Unknown Physician Specialty

## 2020-12-23 MED ORDER — MOLNUPIRAVIR EUA 200MG CAPSULE: 4.0000 | ORAL_CAPSULE | Freq: Two times a day (BID) | ORAL | 0 refills | Status: AC

## 2020-12-23 MED FILL — MOLNUPIRAVIR 200 MG CAPS: 200 | 5 days supply | Qty: 40 | Fill #0

## 2020-12-23 NOTE — Telephone Encounter (Signed)
Called to discuss with patient about COVID-19 symptoms and the use of one of the available treatments for those with mild to moderate Covid symptoms and at a high risk of hospitalization.  Pt appears to qualify for outpatient treatment due to co-morbid conditions and/or a member of an at-risk group in accordance with the FDA Emergency Use Authorization.    Symptom onset: 2/15 Vaccinated: yes Booster? yes Immunocompromised? no Qualifiers: Group home  Unable to reach pt's mother.  Voice box is full  [name]

## 2020-12-23 NOTE — Telephone Encounter (Signed)
Called to discuss with patient about COVID-19 symptoms and the use of one of the available treatments for those with mild to moderate Covid symptoms and at a high risk of hospitalization.  Pt appears to qualify for outpatient treatment due to co-morbid conditions and/or a member of an at-risk group in accordance with the FDA Emergency Use Authorization.    Symptom onset: Unknown Headache, congestion per chart Vaccinated: Yes Booster? Yes Immunocompromised?  Qualifiers: Heart disease  Unable to reach pt - Left message and call back number [phone].  Esther Hardy

## 2020-12-23 NOTE — Assessment & Plan Note (Signed)
History and physical significant for upper respiratory viral illness. Symptoms fit with a Covid diagnosis. Patient swab today for Covid and recommended he isolate until results come back, to his mother Provided patient and his mother with a list of homeopathic remedies for supportive treatment in cases of viral illnesses. I also spent extensive time in the room speaking with him and his mother about different treatments that they could use to lessen their symptoms including drinking plenty of water, resting, Tylenol as needed, nasal sprays, etc.

## 2020-12-23 NOTE — Telephone Encounter (Signed)
Outpatient Oral COVID Treatment Note  I connected with [name]'s mother, his legal guardian, on 12/23/2020/3:05 PM by telephone and verified that I am speaking with the correct person using two identifiers.  I discussed the limitations, risks, security, and privacy concerns of performing an evaluation and management service by telephone and the availability of in person appointments. I also discussed with the patient that there may be a patient responsible charge related to this service. The patient expressed understanding and agreed to proceed.  Patient location: home Provider location: home  Diagnosis: COVID-19 infection  Purpose of visit: Discussion of potential use of Molnupiravir or Paxlovid, a new treatment for mild to moderate COVID-19 viral infection in non-hospitalized patients.   Subjective: Patient is a 35 y.o. male who has been diagnosed with COVID 19 viral infection.  Their symptoms began on 2/14 with nasal congestion.    Past Medical History:  Diagnosis Date  . ASD (atrial septal defect)    a. 11/2012 ASD closure of ostium secundum ASD w/ 34mm Amplatzer septal occluder device;  b. 11/2012 Echo: EF 55-60%, mild MR, Triv PS, ASD closure device in place.  . Autism disorder   . History of echocardiogram    Echo 11/16: EF 55-60%, no RWMA, Gr 2 DD, mild MR, s/p ASD closure with no evidence of shunt, trivial TR, mild Pulmonic stenosis, trivial PI // Echo 11/17: EF 55-60, prominent trabeculation toward the apex, normal wall motion, grade 2 diastolic dysfunction, normal RVSF, mild RAE, ASD closure device well-seated without evidence for leakage, peak RV-RA gradient 27, mild PS (peak 9), PASP 30   . Mental retardation   . Pulmonary artery stenosis, main     Allergies  Allergen Reactions  . Sulfa Antibiotics Other (See Comments)    Violent gastric bleeding     Current Outpatient Medications:  .  aspirin (QC LO-DOSE ASPIRIN) 81 MG EC tablet, Take 1 tablet (81 mg total) by  mouth every morning. Swallow whole., Disp: 30 tablet, Rfl: 11 .  azelastine (ASTELIN) 0.1 % nasal spray, 2 sprays in each nostril as 1-2 times daily as needed, Disp: 30 mL, Rfl: 5 .  BENZOYL PEROXIDE 10 % external wash, WASH UNDER ARMS ONCE DAILY, Disp: 237 g, Rfl: 11 .  carbamide peroxide (DEBROX) 6.5 % otic solution, Place 5 drops into both ears 2 (two) times daily as needed., Disp: 15 mL, Rfl: 0 .  cetirizine (ZYRTEC) 10 MG tablet, TAKE (1) TABLET BY MOUTH ONCE DAILY., Disp: 30 tablet, Rfl: 11 .  Crisaborole (EUCRISA) 2 % OINT, Apply 1 application topically 2 (two) times daily as needed., Disp: 60 g, Rfl: 5 .  fluticasone (FLONASE) 50 MCG/ACT nasal spray, Place 2 sprays into both nostrils daily., Disp: 16 g, Rfl: 11 .  ketoconazole (NIZORAL) 2 % shampoo, APPLY 1 APPLICATION TOPICALLY TWO TIMES A WEEK., Disp: 56 mL, Rfl: 11 .  levocetirizine (XYZAL) 5 MG tablet, TAKE 1 TABLET EACH EVENING., Disp: 30 tablet, Rfl: 11 .  montelukast (SINGULAIR) 10 MG tablet, TAKE 1 TABLET BY MOUTH AT BEDTIME., Disp: 30 tablet, Rfl: 11 .  phenazopyridine (PYRIDIUM) 200 MG tablet, Take 1 tablet (200 mg total) by mouth 3 (three) times daily with meals., Disp: 6 tablet, Rfl: 0  Objective: Patient's mother says he is doing better than her  They are in no apparent distress.  Breathing is non labored.  Mood and behavior are normal.  Laboratory Data:  Recent Results (from the past 2160 hour(s))  Novel Coronavirus, NAA ([facility])  Status: Abnormal   Collection Time: 12/21/20  4:16 PM   Specimen: Nasopharyngeal(NP) swabs in vial transport medium  Result Value Ref Range   SARS-CoV-2, NAA Detected (A) Not Detected    Comment: Patients who have a positive COVID-19 test result may now have treatment options. Treatment options are available for patients with mild to moderate symptoms and for hospitalized patients. Visit our website at [email or web address] for resources and information. This nucleic acid  amplification test was developed and its performance characteristics determined by [employer]. Nucleic acid amplification tests include RT-PCR and TMA. This test has not been FDA cleared or approved. This test has been authorized by FDA under an Emergency Use Authorization (EUA). This test is only authorized for the duration of time the declaration that circumstances exist justifying the authorization of the emergency use of in vitro diagnostic tests for detection of SARS-CoV-2 virus and/or diagnosis of COVID-19 infection under section 564(b)(1) of the Act, 21 U.S.C. 782NFA-2(Z) (1), unless the authorization is terminated or revoked sooner. When diagnostic testing is negativ e, the possibility of a false negative result should be considered in the context of a patient's recent exposures and the presence of clinical signs and symptoms consistent with COVID-19. An individual without symptoms of COVID-19 and who is not shedding SARS-CoV-2 virus would expect to have a negative (not detected) result in this assay.   SARS-COV-2, NAA 2 DAY TAT     Status: None   Collection Time: 12/21/20  4:16 PM  Result Value Ref Range   SARS-CoV-2, NAA 2 DAY TAT Performed      Assessment: 35 y.o. male with mild/moderate COVID 19 viral infection diagnosed on 2/14 at high risk for progression to severe COVID 19.  Plan:  This patient is a 35 y.o. male that meets the following criteria for Emergency Use Authorization of: Molnupiravir  1. Age >18 yr 2. SARS-COV-2 positive test 3. Symptom onset < 5 days 4. Mild-to-moderate COVID disease with high risk for severe progression to hospitalization or death   I have spoken and communicated the following to the patient or parent/caregiver regarding: 1. Molnupiravir is an unapproved drug that is authorized for use under an TEFL teacher.  2. There are no adequate, approved, available products for the treatment of COVID-19 in adults who have  mild-to-moderate COVID-19 and are at high risk for progressing to severe COVID-19, including hospitalization or death. 3. Other therapeutics are currently authorized. For additional information on all products authorized for treatment or prevention of COVID-19, please see [email or web address].graham-[email or web address]  4. There are benefits and risks of taking this treatment as outlined in the "Fact Sheet for Patients and Caregivers."  5. "Fact Sheet for Patients and Caregivers" was reviewed with patient. A hard copy will be provided to patient from pharmacy prior to the patient receiving treatment. 6. Patients should continue to self-isolate and use infection control measures (e.g., wear mask, isolate, social distance, avoid sharing personal items, clean and disinfect "high touch" surfaces, and frequent handwashing) according to CDC guidelines.  7. The patient or parent/caregiver has the option to accept or refuse treatment. 8. [employer] has established a pregnancy surveillance program. 9. Females of childbearing potential should use a reliable method of contraception correctly and consistently, as applicable, for the duration of treatment and for 4 days after the last dose of Molnupiravir. 10. Males of reproductive potential who are sexually active with females of childbearing potential should use a reliable method of contraception correctly and consistently during treatment and  for at least 3 months after the last dose. 11. Pregnancy status and risk was assessed. Patient verbalized understanding of precautions.   After reviewing above information with the patient, the patient agrees to receive molnupiravir.  Follow up instructions:    . Take prescription BID x 5 days as directed . Reach out to pharmacist for counseling on medication if desired . For concerns regarding further COVID symptoms please follow up  with your PCP or urgent care . For urgent or life-threatening issues, seek care at your local emergency department  The patient's mother was provided an opportunity to ask questions, and all were answered. The patient agreed with the plan and demonstrated an understanding of the instructions.   Script sent to [facility] and opted to pick up RX.  The patient's mother was advised to call their PCP or seek an in-person evaluation if the symptoms worsen or if the condition fails to improve as anticipated.   I provided 15 minutes of non face-to-face telephone visit time during this encounter, and > 50% was spent counseling as documented under my assessment & plan.  Gabriel Cirri, NP 12/23/2020 Dellia Cloud PM

## 2021-03-08 ENCOUNTER — Ambulatory Visit (INDEPENDENT_AMBULATORY_CARE_PROVIDER_SITE_OTHER): Payer: Medicaid Other | Admitting: Family Medicine

## 2021-03-08 ENCOUNTER — Encounter: Payer: Self-pay | Admitting: Family Medicine

## 2021-03-08 ENCOUNTER — Other Ambulatory Visit: Payer: Self-pay

## 2021-03-08 VITALS — BP 104/72 | HR 70 | Ht 73.0 in | Wt 230.4 lb

## 2021-03-08 DIAGNOSIS — F840 Autistic disorder: Secondary | ICD-10-CM | POA: Diagnosis not present

## 2021-03-08 DIAGNOSIS — R17 Unspecified jaundice: Secondary | ICD-10-CM

## 2021-03-08 NOTE — Patient Instructions (Signed)
It was nice to see you today,  I am getting a blood test called CMP to check liver and kidney function I will call you with results when I get them.  From my standpoint he is okay to participate in the [employer].  I have filled out the form.  Have a great day,  Frederic Jericho, MD

## 2021-03-08 NOTE — Progress Notes (Signed)
    SUBJECTIVE:   CHIEF COMPLAINT / HPI:   Patient is accompanied by his mother.  He is here to have physical filled out for participating in the [employer].  No complaints.  Has not had any issues with UTIs recently.  PERTINENT  PMH / PSH: Autism, intellectual disability  OBJECTIVE:   BP 104/72   Pulse 70   Ht 6\' 1"  (1.854 m)   Wt 230 lb 6.4 oz (104.5 kg)   SpO2 97%   BMI 30.40 kg/m   General: Alert and oriented.  No acute distress.  Accompanied by mother. HEENT: PERRLA, EOMI, moist oral mucosa CV: Regular rate and rhythm, no murmurs Pulmonary: Lungs clear to auscultation bilaterally GI: Soft, nontender. MSK: 5/5 strength upper and lower extremities bilaterally. Skin: No rashes. Psych: Patient able to somewhat interact with limited vocabulary.  Able to follow instructions.  ASSESSMENT/PLAN:   Autistic disorder Form filled out to compete in the Special [employer].  No barriers to participation.  Elevated bilirubin Previous total bilirubin was slightly elevated.  This was a year ago.  We will repeat to make sure this has not worsened.  No symptoms.     , MD [facility]

## 2021-03-09 DIAGNOSIS — R17 Unspecified jaundice: Secondary | ICD-10-CM | POA: Insufficient documentation

## 2021-03-09 LAB — COMPREHENSIVE METABOLIC PANEL
ALT: 19 IU/L (ref 0–44)
AST: 23 IU/L (ref 0–40)
Albumin/Globulin Ratio: 1.5 (ref 1.2–2.2)
Albumin: 4.7 g/dL (ref 4.0–5.0)
Alkaline Phosphatase: 73 IU/L (ref 44–121)
BUN/Creatinine Ratio: 13 (ref 9–20)
BUN: 14 mg/dL (ref 6–20)
Bilirubin Total: 0.9 mg/dL (ref 0.0–1.2)
CO2: 23 mmol/L (ref 20–29)
Calcium: 10.1 mg/dL (ref 8.7–10.2)
Chloride: 101 mmol/L (ref 96–106)
Creatinine, Ser: 1.12 mg/dL (ref 0.76–1.27)
Globulin, Total: 3.1 g/dL (ref 1.5–4.5)
Glucose: 72 mg/dL (ref 65–99)
Potassium: 4.5 mmol/L (ref 3.5–5.2)
Sodium: 140 mmol/L (ref 134–144)
Total Protein: 7.8 g/dL (ref 6.0–8.5)
eGFR: 89 mL/min/{1.73_m2} (ref >59)

## 2021-03-09 NOTE — Assessment & Plan Note (Signed)
Form filled out to compete in the [employer].  No barriers to participation.

## 2021-03-09 NOTE — Assessment & Plan Note (Signed)
Previous total bilirubin was slightly elevated.  This was a year ago.  We will repeat to make sure this has not worsened.  No symptoms.

## 2021-04-11 ENCOUNTER — Other Ambulatory Visit: Payer: Self-pay | Admitting: Family Medicine

## 2021-04-28 ENCOUNTER — Other Ambulatory Visit: Payer: Self-pay | Admitting: Family Medicine

## 2021-04-28 DIAGNOSIS — L732 Hidradenitis suppurativa: Secondary | ICD-10-CM

## 2021-04-28 DIAGNOSIS — L219 Seborrheic dermatitis, unspecified: Secondary | ICD-10-CM

## 2021-05-02 ENCOUNTER — Other Ambulatory Visit: Payer: Self-pay | Admitting: Family Medicine

## 2021-05-30 ENCOUNTER — Ambulatory Visit (INDEPENDENT_AMBULATORY_CARE_PROVIDER_SITE_OTHER): Payer: Medicaid Other | Admitting: Family Medicine

## 2021-05-30 ENCOUNTER — Other Ambulatory Visit: Payer: Self-pay

## 2021-05-30 ENCOUNTER — Encounter: Payer: Self-pay | Admitting: Family Medicine

## 2021-05-30 VITALS — BP 104/76 | HR 84 | Ht 73.0 in | Wt 234.4 lb

## 2021-05-30 DIAGNOSIS — Z593 Problems related to living in residential institution: Secondary | ICD-10-CM | POA: Diagnosis present

## 2021-05-30 NOTE — Assessment & Plan Note (Signed)
Patient presents with caretaker to get [place] 2 form filled out.  He has to do these annually.  No recent medication changes.  Due to his continued Pyridium an Molnupiravir.

## 2021-05-30 NOTE — Patient Instructions (Addendum)
It was great seeing you today!  I am glad [name] is doing so well.  I filled out the [facility] 2 forms that you need.  If you have any questions or concerns please call the clinic.  I hope you have a wonderful afternoon!

## 2021-05-30 NOTE — Progress Notes (Signed)
    SUBJECTIVE:   CHIEF COMPLAINT / HPI:   [place] 2 form Patient presents for [place] 2 form to be filled out.  His mother brings him.  She reports that she has no major concerns at this time and is doing very well.  He has not recently been ill.  Her 1 topic of discussion is his weight.  His weight has begun to increase again.  He lost weight when the pandemic started but is gaining it back.  She reports that he gets snacks at night but they are working on decreasing those.  No other issues at this time.  OBJECTIVE:   BP 104/76   Pulse 84   Ht 6\' 1"  (1.854 m)   Wt 234 lb 6.4 oz (106.3 kg)   SpO2 95%   BMI 30.93 kg/m   General: Well-appearing 35 year old male, no acute distress Cardiac: Regular rate and rhythm, no murmur appreciated Respiratory: Normal for breathing, lungs clear to auscultation bilaterally Abdomen: Soft, nontender, positive bowel sounds MSK: No gross abnormalities Psych: Responsive to questions, no acute distress or anxiety noted  ASSESSMENT/PLAN:   Lives in group home Patient presents with caretaker to get [place] 2 form filled out.  He has to do these annually.  No recent medication changes.  Due to his continued Pyridium an Molnupiravir.     34, MD [facility]

## 2021-06-17 ENCOUNTER — Other Ambulatory Visit: Payer: Self-pay | Admitting: Family Medicine

## 2021-06-17 DIAGNOSIS — L732 Hidradenitis suppurativa: Secondary | ICD-10-CM

## 2021-07-19 ENCOUNTER — Other Ambulatory Visit: Payer: Self-pay | Admitting: Family Medicine

## 2021-08-01 ENCOUNTER — Other Ambulatory Visit: Payer: Self-pay | Admitting: Family Medicine

## 2021-08-18 ENCOUNTER — Other Ambulatory Visit: Payer: Self-pay | Admitting: Family Medicine

## 2021-08-18 DIAGNOSIS — L219 Seborrheic dermatitis, unspecified: Secondary | ICD-10-CM

## 2021-08-18 DIAGNOSIS — L732 Hidradenitis suppurativa: Secondary | ICD-10-CM

## 2021-09-12 ENCOUNTER — Other Ambulatory Visit: Payer: Self-pay | Admitting: Family Medicine

## 2021-10-11 ENCOUNTER — Other Ambulatory Visit: Payer: Self-pay | Admitting: Family Medicine

## 2021-10-19 ENCOUNTER — Other Ambulatory Visit: Payer: Self-pay | Admitting: Family Medicine

## 2021-10-19 DIAGNOSIS — L219 Seborrheic dermatitis, unspecified: Secondary | ICD-10-CM

## 2021-10-23 ENCOUNTER — Other Ambulatory Visit: Payer: Self-pay | Admitting: Family Medicine

## 2021-10-31 ENCOUNTER — Ambulatory Visit (INDEPENDENT_AMBULATORY_CARE_PROVIDER_SITE_OTHER): Payer: Medicaid Other

## 2021-10-31 ENCOUNTER — Ambulatory Visit (INDEPENDENT_AMBULATORY_CARE_PROVIDER_SITE_OTHER): Payer: Medicaid Other | Admitting: Family Medicine

## 2021-10-31 ENCOUNTER — Encounter: Payer: Self-pay | Admitting: Family Medicine

## 2021-10-31 ENCOUNTER — Other Ambulatory Visit: Payer: Self-pay

## 2021-10-31 VITALS — BP 127/86 | HR 78 | Ht 73.0 in | Wt 235.5 lb

## 2021-10-31 DIAGNOSIS — E669 Obesity, unspecified: Secondary | ICD-10-CM | POA: Diagnosis present

## 2021-10-31 DIAGNOSIS — Z23 Encounter for immunization: Secondary | ICD-10-CM

## 2021-10-31 DIAGNOSIS — Z131 Encounter for screening for diabetes mellitus: Secondary | ICD-10-CM | POA: Diagnosis not present

## 2021-10-31 LAB — POCT GLYCOSYLATED HEMOGLOBIN (HGB A1C): Hemoglobin A1C: 5.6 % (ref 4.0–5.6)

## 2021-10-31 NOTE — Progress Notes (Signed)
° ° °  SUBJECTIVE:   Chief compliant/HPI: annual examination  [name] is a 35 y.o. who presents today for an annual exam.   Reviewed and updated history.   Review of systems form notable for reported weight gain.  Caregiver reports no increased urination, increased thirst, recent illnesses.  Caregiver and patient's mother have no concerns other than they would like him evaluated for diabetes.  They would also like for him to receive the flu and COVID vaccines.  OBJECTIVE:   BP 127/86    Pulse 78    Ht 6\' 1"  (1.854 m)    Wt 235 lb 8 oz (106.8 kg)    SpO2 100%    BMI 31.07 kg/m   Physical Exam Vitals reviewed.  Constitutional:      General: He is not in acute distress.    Appearance: Normal appearance. He is obese.  HENT:     Head: Normocephalic and atraumatic.     Right Ear: External ear normal.     Left Ear: External ear normal.     Nose: Congestion present. No rhinorrhea.     Mouth/Throat:     Mouth: Mucous membranes are moist.  Eyes:     Extraocular Movements: Extraocular movements intact.     Pupils: Pupils are equal, round, and reactive to light.  Cardiovascular:     Rate and Rhythm: Normal rate and regular rhythm.     Pulses: Normal pulses.     Heart sounds: Normal heart sounds.  Pulmonary:     Effort: Pulmonary effort is normal.     Breath sounds: Normal breath sounds.  Abdominal:     General: Abdomen is flat. Bowel sounds are normal.     Palpations: Abdomen is soft.  Musculoskeletal:     Cervical back: Normal range of motion.  Skin:    General: Skin is warm.     Capillary Refill: Capillary refill takes less than 2 seconds.  Neurological:     Mental Status: He is alert. Mental status is at baseline.  Psychiatric:     Comments: Patient is quiet and reserved, per patient's brother this is his baseline, will respond to questions, obvious speech impediment     ASSESSMENT/PLAN:   Obesity (BMI 30-39.9) Patient with mild obesity.  We will check  hemoglobin A1c today to evaluate for diabetes.    Annual Examination  See AVS for age appropriate recommendations  Blood pressure reviewed and at goal.     Considered the following items based upon USPSTF recommendations: Hepatitis C: declined Syphilis if at high risk: {not indicated GC/CTnot indicated Lipid panel (nonfasting or fasting) discussed based upon AHA recommendations and not ordered.   Immunizations flu vaccine today  Follow up in 1 year or sooner if indicated.    , MD [facility]

## 2021-10-31 NOTE — Patient Instructions (Signed)
It was wonderful seeing you today!  Glad you are doing well.  We are going to screen you for diabetes and I will send the results to your MyChart or call you if there are any abnormalities.  You are getting the flu shot and COVID booster today.  If you have any questions or concerns please feel free to call the clinic.  Happy have a wonderful afternoon!

## 2021-11-01 DIAGNOSIS — E669 Obesity, unspecified: Secondary | ICD-10-CM | POA: Insufficient documentation

## 2021-11-01 NOTE — Assessment & Plan Note (Signed)
Patient with mild obesity.  We will check hemoglobin A1c today to evaluate for diabetes.

## 2021-11-13 ENCOUNTER — Other Ambulatory Visit: Payer: Self-pay | Admitting: Family Medicine

## 2021-12-16 ENCOUNTER — Other Ambulatory Visit: Payer: Self-pay | Admitting: Family Medicine

## 2021-12-16 DIAGNOSIS — L219 Seborrheic dermatitis, unspecified: Secondary | ICD-10-CM

## 2021-12-18 ENCOUNTER — Other Ambulatory Visit: Payer: Self-pay | Admitting: Family Medicine

## 2022-01-04 ENCOUNTER — Other Ambulatory Visit: Payer: Self-pay | Admitting: Family Medicine

## 2022-01-24 ENCOUNTER — Other Ambulatory Visit: Payer: Self-pay | Admitting: Family Medicine

## 2022-03-01 ENCOUNTER — Other Ambulatory Visit: Payer: Self-pay | Admitting: Family Medicine

## 2022-03-22 ENCOUNTER — Other Ambulatory Visit: Payer: Self-pay | Admitting: Family Medicine

## 2022-04-10 ENCOUNTER — Encounter: Payer: Self-pay | Admitting: *Deleted

## 2022-04-17 ENCOUNTER — Other Ambulatory Visit: Payer: Self-pay | Admitting: Family Medicine

## 2022-04-19 ENCOUNTER — Other Ambulatory Visit: Payer: Self-pay | Admitting: Family Medicine

## 2022-04-26 ENCOUNTER — Other Ambulatory Visit: Payer: Self-pay | Admitting: Family Medicine

## 2022-06-01 ENCOUNTER — Ambulatory Visit (INDEPENDENT_AMBULATORY_CARE_PROVIDER_SITE_OTHER): Payer: Medicaid Other | Admitting: Family Medicine

## 2022-06-01 DIAGNOSIS — F840 Autistic disorder: Secondary | ICD-10-CM | POA: Diagnosis present

## 2022-06-01 NOTE — Assessment & Plan Note (Signed)
Reviewed [place] 2 form and medications. Completed the form follow-up with PCP as needed.

## 2022-06-01 NOTE — Progress Notes (Signed)
     SUBJECTIVE:   CHIEF COMPLAINT / HPI:   [name] is a 36 y.o. male presents for follow up   Mother [name]-patient's legal guardian present at visit  Presents today for [facility] paperwork to be completed for group home.  Patient has autism and ADHD and lives in a group home.  Flowsheet Row Office Visit from 06/01/2022 in [facility]  PHQ-9 Total Score 3       PERTINENT  PMH / PSH: Autism, ADHD, ASD, pulmonic stenosis  OBJECTIVE:   BP 117/82   Pulse 74   Wt 221 lb 3.2 oz (100.3 kg)   SpO2 100%   BMI 29.18 kg/m    General: Alert, no acute distress, well-appearing Cardio: Well-perfused Pulm: Normal work of breathing Neuro: Cranial nerves grossly intact   ASSESSMENT/PLAN:   Autism Reviewed [place] 2 form and medications. Completed the form follow-up with PCP as needed.    Mark Octave, MD PGY-3 [facility]

## 2022-07-11 ENCOUNTER — Other Ambulatory Visit: Payer: Self-pay | Admitting: Student

## 2022-07-11 ENCOUNTER — Other Ambulatory Visit: Payer: Self-pay

## 2022-07-11 DIAGNOSIS — L732 Hidradenitis suppurativa: Secondary | ICD-10-CM

## 2022-07-11 DIAGNOSIS — H6123 Impacted cerumen, bilateral: Secondary | ICD-10-CM

## 2022-07-11 DIAGNOSIS — L219 Seborrheic dermatitis, unspecified: Secondary | ICD-10-CM

## 2022-07-11 MED ORDER — CETIRIZINE HCL 10 MG PO TABS: ORAL_TABLET | ORAL | 11 refills | Status: DC

## 2022-07-11 MED ORDER — MONTELUKAST SODIUM 10 MG PO TABS: 10.0000 mg | ORAL_TABLET | Freq: Every day | ORAL | 0 refills | Status: DC

## 2022-07-11 MED ORDER — KETOCONAZOLE 2 % EX SHAM: MEDICATED_SHAMPOO | CUTANEOUS | 0 refills | Status: DC

## 2022-07-11 MED ORDER — ASPIRIN 81 MG PO TBEC: 81.0000 mg | DELAYED_RELEASE_TABLET | Freq: Every morning | ORAL | 0 refills | Status: DC

## 2022-07-11 MED ORDER — CARBAMIDE PEROXIDE 6.5 % OT SOLN: 5.0000 [drp] | Freq: Two times a day (BID) | OTIC | 0 refills | Status: DC | PRN

## 2022-07-11 MED ORDER — BENZOYL PEROXIDE WASH 10 % EX LIQD: CUTANEOUS | 0 refills | Status: DC

## 2022-07-11 MED ORDER — FLUTICASONE PROPIONATE 50 MCG/ACT NA SUSP: 2.0000 | Freq: Every day | NASAL | 0 refills | Status: DC

## 2022-07-11 MED ORDER — LEVOCETIRIZINE DIHYDROCHLORIDE 5 MG PO TABS: 5.0000 mg | ORAL_TABLET | Freq: Every evening | ORAL | 0 refills | Status: DC

## 2022-07-11 NOTE — Telephone Encounter (Signed)
Patient is also needing refills on:  Benzoyl Peroxide Carbamide Peroxide Cetirizine Ketoconazole  Please send to pharmacy on file.   Thanks!

## 2022-07-13 ENCOUNTER — Other Ambulatory Visit: Payer: Self-pay | Admitting: Student

## 2022-07-13 DIAGNOSIS — L732 Hidradenitis suppurativa: Secondary | ICD-10-CM

## 2022-07-13 DIAGNOSIS — L219 Seborrheic dermatitis, unspecified: Secondary | ICD-10-CM

## 2022-07-13 MED ORDER — ASPIRIN 81 MG PO TBEC: 81.0000 mg | DELAYED_RELEASE_TABLET | Freq: Every morning | ORAL | 0 refills | Status: DC

## 2022-09-10 ENCOUNTER — Other Ambulatory Visit: Payer: Self-pay | Admitting: Student

## 2022-09-10 MED ORDER — LEVOCETIRIZINE DIHYDROCHLORIDE 5 MG PO TABS
5.0000 mg | ORAL_TABLET | Freq: Every evening | ORAL | 3 refills | Status: DC
Start: 2022-09-10 — End: 2023-01-11

## 2022-09-10 MED ORDER — MONTELUKAST SODIUM 10 MG PO TABS
10.0000 mg | ORAL_TABLET | Freq: Every day | ORAL | 3 refills | Status: DC
Start: 2022-09-10 — End: 2023-01-11

## 2022-10-17 ENCOUNTER — Other Ambulatory Visit: Payer: Self-pay | Admitting: Student

## 2022-10-17 ENCOUNTER — Other Ambulatory Visit: Payer: Self-pay | Admitting: Family Medicine

## 2022-11-12 ENCOUNTER — Other Ambulatory Visit: Payer: Self-pay | Admitting: Family Medicine

## 2022-12-13 ENCOUNTER — Other Ambulatory Visit: Payer: Self-pay | Admitting: Student

## 2022-12-13 MED ORDER — FLUTICASONE PROPIONATE 50 MCG/ACT NA SUSP: 2.0000 | Freq: Every day | NASAL | 3 refills | Status: DC

## 2022-12-13 NOTE — Telephone Encounter (Signed)
Received call from pharmacy regarding denied medication.   She reports that they never received prescription with 11 refills.   Will forward to PCP.   Talbot Grumbling, RN

## 2022-12-13 NOTE — Addendum Note (Signed)
Addended by: Talbot Grumbling on: 12/13/2022 11:13 AM   Modules accepted: Orders

## 2022-12-13 NOTE — Addendum Note (Signed)
Addended by: Eustace Quail on: 12/13/2022 11:29 AM   Modules accepted: Orders

## 2023-01-11 ENCOUNTER — Other Ambulatory Visit: Payer: Self-pay | Admitting: Student

## 2023-01-17 NOTE — Progress Notes (Deleted)
  SUBJECTIVE:   CHIEF COMPLAINT / HPI:   ***  PERTINENT  PMH / PSH: Chromosome 8P deletion, pulmonary artery stenosis, autism, ASD  Past Medical History:  Diagnosis Date   ASD (atrial septal defect)    a. 11/2012 ASD closure of ostium secundum ASD w/ 72mm Amplatzer septal occluder device;  b. 11/2012 Echo: EF 55-60%, mild MR, Triv PS, ASD closure device in place.   Autism disorder    History of echocardiogram    Echo 11/16: EF 55-60%, no RWMA, Gr 2 DD, mild MR, s/p ASD closure with no evidence of shunt, trivial TR, mild Pulmonic stenosis, trivial PI // Echo 11/17: EF 55-60, prominent trabeculation toward the apex, normal wall motion, grade 2 diastolic dysfunction, normal RVSF, mild RAE, ASD closure device well-seated without evidence for leakage, peak RV-RA gradient 27, mild PS (peak 9), PASP 30    Mental retardation    Pulmonary artery stenosis, main     Patient Care Team: Wells Guiles, DO as PCP - General (Family Medicine) Larey Dresser, MD as Consulting Physician (Cardiology) OBJECTIVE:  There were no vitals taken for this visit. Physical Exam   ASSESSMENT/PLAN:  There are no diagnoses linked to this encounter. No follow-ups on file. Wells Guiles, DO 01/17/2023, 12:50 PM PGY-***, [facility] {    This will disappear when note is signed, click to select method of visit    :1}

## 2023-01-18 ENCOUNTER — Ambulatory Visit: Payer: Medicaid Other | Admitting: Student

## 2023-02-18 ENCOUNTER — Encounter: Payer: Self-pay | Admitting: *Deleted

## 2023-04-03 ENCOUNTER — Other Ambulatory Visit: Payer: Self-pay | Admitting: Student

## 2023-04-15 ENCOUNTER — Ambulatory Visit (INDEPENDENT_AMBULATORY_CARE_PROVIDER_SITE_OTHER): Payer: Medicaid Other | Admitting: Family Medicine

## 2023-04-15 ENCOUNTER — Encounter: Payer: Self-pay | Admitting: Family Medicine

## 2023-04-15 VITALS — BP 122/69 | HR 95 | Temp 101.3°F | Ht 73.0 in | Wt 205.8 lb

## 2023-04-15 DIAGNOSIS — R509 Fever, unspecified: Secondary | ICD-10-CM | POA: Insufficient documentation

## 2023-04-15 HISTORY — DX: Fever, unspecified: R50.9

## 2023-04-15 LAB — POC SOFIA SARS ANTIGEN FIA: SARS Coronavirus 2 Ag: NEGATIVE

## 2023-04-15 MED ORDER — IBUPROFEN 200 MG PO TABS
600.0000 mg | ORAL_TABLET | Freq: Once | ORAL | Status: AC
Start: 2023-04-15 — End: 2023-04-15
  Administered 2023-04-15: 600 mg via ORAL

## 2023-04-15 NOTE — Progress Notes (Signed)
    SUBJECTIVE:   CHIEF COMPLAINT / HPI:   Fever Felt hot to touch this morning. Also seemed tired and complained that he's not feeling well. No known cough or congestion, no known vomiting or diarrhea. No abdominal pain. No urinary symptoms. No sick contacts that he's aware of. Lives in group home but went home to stay with family this weekend.  PERTINENT  PMH / PSH: autism  OBJECTIVE:   BP 122/69   Pulse 95   Temp (!) 101.3 F (38.5 C)   Ht 6\' 1"  (1.854 m)   Wt 205 lb 12.8 oz (93.4 kg)   SpO2 98%   BMI 27.15 kg/m   Gen: alert, tired-appearing, NAD Head: [place]/AT Eyes: normal sclera and conjunctiva Ears: L TM not fully visualized due to wax, visualized portion was normal, R TM normal Nose: nares patent, no appreciable turbinate hypertrophy Throat: oropharynx unremarkable without tonsillar edema, erythema or exudate Neck: no cervical or supraclavicular lymphadenopathy CV: RRR, normal S1/S2 without m/r/g Resp: normal effort, lungs CTAB  GI: abdomen soft, nontender, nondistended Skin: no rashes on exposed skin Neuro: no obvious focal deficits   ASSESSMENT/PLAN:   Fever Fever since this morning. No associated symptoms and no localizing findings on exam. Likely early in the course of viral illness. COVID negative. Advised supportive care. Ibuprofen given in the office. Return precautions reviewed. If persistent, would recommend UA as he has had UTIs in the past.     Maury Dus, MD [facility]

## 2023-04-15 NOTE — Assessment & Plan Note (Addendum)
Fever since this morning. No associated symptoms and no localizing findings on exam. Likely early in the course of viral illness. COVID negative. Advised supportive care. Ibuprofen given in the office. Return precautions reviewed. If persistent, would recommend UA as he has had UTIs in the past.

## 2023-04-15 NOTE — Patient Instructions (Addendum)
It was great to meet you!  Things we discussed at today's visit: - I'm sorry you're not feeling well. - Your COVID test was negative. - This is likely some other virus. - You can alternate Tylenol and Ibuprofen every 4 hours as needed for fever. - Be sure to stay well-hydrated. Water and Pedialyte are best.  - LOTS of hand washing. - You may develop cough, congestion, diarrhea or other symptoms in the coming days. If you have any concerns please reach out. If your fever lasts more than 5 days straight please let us know.  Take care and seek immediate care sooner if you develop any concerns.  Dr. [facility]

## 2023-05-04 NOTE — Progress Notes (Deleted)
  SUBJECTIVE:   CHIEF COMPLAINT / HPI:   ***  PERTINENT  PMH / PSH: Autism, lives in group home  Past Medical History:  Diagnosis Date   ASD (atrial septal defect)    a. 11/2012 ASD closure of ostium secundum ASD w/ 16mm Amplatzer septal occluder device;  b. 11/2012 Echo: EF 55-60%, mild MR, Triv PS, ASD closure device in place.   Autism disorder    History of echocardiogram    Echo 11/16: EF 55-60%, no RWMA, Gr 2 DD, mild MR, s/p ASD closure with no evidence of shunt, trivial TR, mild Pulmonic stenosis, trivial PI // Echo 11/17: EF 55-60, prominent trabeculation toward the apex, normal wall motion, grade 2 diastolic dysfunction, normal RVSF, mild RAE, ASD closure device well-seated without evidence for leakage, peak RV-RA gradient 27, mild PS (peak 9), PASP 30    Mental retardation    Pulmonary artery stenosis, main     Patient Care Team: [name], DO as PCP - General (Family Medicine) Laurey Morale, MD as Consulting Physician (Cardiology) OBJECTIVE:  There were no vitals taken for this visit. Physical Exam   ASSESSMENT/PLAN:  There are no diagnoses linked to this encounter. No follow-ups on file. [name], DO 05/04/2023, 12:41 PM PGY-***, [facility] {    This will disappear when note is signed, click to select method of visit    :1}

## 2023-05-06 ENCOUNTER — Ambulatory Visit: Payer: Medicaid Other | Admitting: Student

## 2023-05-14 ENCOUNTER — Ambulatory Visit (INDEPENDENT_AMBULATORY_CARE_PROVIDER_SITE_OTHER): Payer: MEDICAID | Admitting: Student

## 2023-05-14 VITALS — BP 118/80 | HR 72 | Ht 73.0 in | Wt 200.0 lb

## 2023-05-14 DIAGNOSIS — F840 Autistic disorder: Secondary | ICD-10-CM

## 2023-05-14 DIAGNOSIS — J309 Allergic rhinitis, unspecified: Secondary | ICD-10-CM

## 2023-05-14 DIAGNOSIS — L219 Seborrheic dermatitis, unspecified: Secondary | ICD-10-CM | POA: Diagnosis not present

## 2023-05-14 DIAGNOSIS — F419 Anxiety disorder, unspecified: Secondary | ICD-10-CM

## 2023-05-14 DIAGNOSIS — L732 Hidradenitis suppurativa: Secondary | ICD-10-CM | POA: Diagnosis not present

## 2023-05-14 DIAGNOSIS — Q221 Congenital pulmonary valve stenosis: Secondary | ICD-10-CM

## 2023-05-14 DIAGNOSIS — J302 Other seasonal allergic rhinitis: Secondary | ICD-10-CM

## 2023-05-14 MED ORDER — LEVOCETIRIZINE DIHYDROCHLORIDE 5 MG PO TABS: 5.0000 mg | ORAL_TABLET | Freq: Every day | ORAL | 11 refills | Status: DC

## 2023-05-14 MED ORDER — ASPIRIN 81 MG PO TBEC: 81.0000 mg | DELAYED_RELEASE_TABLET | Freq: Every morning | ORAL | 11 refills | Status: DC

## 2023-05-14 MED ORDER — FLUTICASONE PROPIONATE 50 MCG/ACT NA SUSP: 2.0000 | Freq: Every day | NASAL | 3 refills | Status: DC

## 2023-05-14 MED ORDER — BENZOYL PEROXIDE 10 % EX LIQD: CUTANEOUS | 0 refills | Status: DC

## 2023-05-14 MED ORDER — MONTELUKAST SODIUM 10 MG PO TABS
10.0000 mg | ORAL_TABLET | Freq: Every day | ORAL | 11 refills | Status: DC
Start: 2023-05-14 — End: 2023-11-15

## 2023-05-14 MED ORDER — LORAZEPAM 0.5 MG PO TABS
0.5000 mg | ORAL_TABLET | Freq: Once | ORAL | 0 refills | Status: AC | PRN
Start: 2023-05-14 — End: ?

## 2023-05-14 MED ORDER — KETOCONAZOLE 2 % EX SHAM: MEDICATED_SHAMPOO | CUTANEOUS | 0 refills | Status: DC

## 2023-05-14 MED ORDER — EUCRISA 2 % EX OINT: 1.0000 "application " | TOPICAL_OINTMENT | Freq: Two times a day (BID) | CUTANEOUS | 5 refills | Status: DC | PRN

## 2023-05-14 NOTE — Patient Instructions (Addendum)
It was great to see you today! Thank you for choosing [facility] for your primary care. [name] was seen for [facility] 2 form and medication refill with medication for anxiety.  Today we addressed: I have admitted Ativan to your pharmacy.  After taking 1, if it is not working you may take another.  I hope you enjoy your flight to [place].  If you haven't already, sign up for My Chart to have easy access to your labs results, and communication with your primary care physician.  Please arrive 15 minutes before your appointment to ensure smooth check in process.  We appreciate your efforts in making this happen.  Thank you for allowing me to participate in your care, [name], DO 05/14/2023, 3:06 PM PGY-3, [facility]

## 2023-05-14 NOTE — Progress Notes (Signed)
  SUBJECTIVE:   CHIEF COMPLAINT / HPI:   Presents today with mother for [place] 2 form completion, medication refills and prescription for anxiety as they will be going on a flight to [place] very soon.  In the past he has been given Ativan for this although it did not work well enough.  PERTINENT  PMH / PSH: Autism  Patient Care Team: [name], DO as PCP - General (Family Medicine) Laurey Morale, MD as Consulting Physician (Cardiology) OBJECTIVE:  BP 118/80 (BP Location: Right Arm, Patient Position: Sitting)   Pulse 72   Ht 6\' 1"  (1.854 m)   Wt 200 lb (90.7 kg)   SpO2 96%   BMI 26.39 kg/m  Physical Exam Constitutional:      Appearance: Normal appearance.  Neurological:     Mental Status: He is alert.  Psychiatric:        Mood and Affect: Mood normal.        Behavior: Behavior normal.    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  Recommended giving Ativan prior to flight and if needed may take more if desired effect is not achieved.  I have prescribed enough for flight to and from.  Orders: -     LORazepam; Take 1 tablet (0.5 mg total) by mouth once as needed for up to 1 dose for anxiety (May take another 30 min after if unsatisfactory response for flight anxiety).  Dispense: 10 tablet; Refill: 0  Autism Assessment & Plan: Reviewed [place] 2 form and medications and updated accordingly.  Completed form and provided to family and had a copy scanned into chart.   Hidradenitis axillaris -     Benzoyl Peroxide; WASH UNDER ARMS ONCE DAILY  Dispense: 237 g; Refill: 0  Seborrheic dermatitis of scalp -     Ketoconazole; APPLY TO AFFECTED AREA TWICE A WEEK ON SUNDAY AND WEDNESDAY  Dispense: 120 mL; Refill: 0  Pulmonic stenosis, congenital -     Aspirin; Take 1 tablet (81 mg total) by mouth every morning.  Dispense: 30 tablet; Refill: 11  Seasonal allergies -     Montelukast Sodium; Take 1 tablet (10 mg total) by mouth at bedtime.  Dispense: 30  tablet; Refill: 11 -     Levocetirizine Dihydrochloride; Take 1 tablet (5 mg total) by mouth at bedtime.  Dispense: 30 tablet; Refill: 11  Allergic rhinitis, unspecified seasonality, unspecified trigger -     Fluticasone Propionate; Place 2 sprays into both nostrils daily.  Dispense: 16 g; Refill: 3  Other orders -     [place]; Apply 1 application  topically 2 (two) times daily as needed.  Dispense: 60 g; Refill: 5  [name], DO 05/14/2023, 4:03 PM PGY-3, [facility]

## 2023-05-14 NOTE — Assessment & Plan Note (Signed)
Reviewed [place] 2 form and medications and updated accordingly.  Completed form and provided to family and had a copy scanned into chart.

## 2023-05-14 NOTE — Assessment & Plan Note (Signed)
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  Recommended giving Ativan prior to flight and if needed may take more if desired effect is not achieved.  I have prescribed enough for flight to and from.

## 2023-05-16 ENCOUNTER — Other Ambulatory Visit: Payer: Self-pay

## 2023-05-16 DIAGNOSIS — F419 Anxiety disorder, unspecified: Secondary | ICD-10-CM

## 2023-05-16 MED ORDER — LORAZEPAM 0.5 MG PO TABS
0.5000 mg | ORAL_TABLET | Freq: Once | ORAL | 0 refills | Status: DC | PRN
Start: 2023-05-16 — End: 2023-11-27

## 2023-05-16 NOTE — Telephone Encounter (Signed)
Patient's mother calls nurse line regarding issues with lorazepam prescription.   She needs this transferred to [facility] in [place].   I have canceled prescription at [facility].   Due to being controlled, provider will need to resend.   Forwarding to PCP.   Veronda Prude, RN

## 2023-06-06 ENCOUNTER — Telehealth: Payer: Self-pay

## 2023-06-06 ENCOUNTER — Other Ambulatory Visit (HOSPITAL_COMMUNITY): Payer: Self-pay

## 2023-06-07 ENCOUNTER — Other Ambulatory Visit (HOSPITAL_COMMUNITY): Payer: Self-pay

## 2023-06-07 ENCOUNTER — Telehealth: Payer: Self-pay

## 2023-06-07 NOTE — Telephone Encounter (Signed)
Pharmacy Patient Advocate Encounter   Received notification from CoverMyMeds that prior authorization for [facility] is required/requested.   The patient is insured through [employer] [place] .   Per test claim: PA required; PA submitted to [facility] via CoverMyMeds Key/confirmation #/EOC [id number]. Status is pending.

## 2023-06-12 ENCOUNTER — Other Ambulatory Visit (HOSPITAL_COMMUNITY): Payer: Self-pay

## 2023-06-12 NOTE — Telephone Encounter (Signed)
ERROR

## 2023-06-12 NOTE — Telephone Encounter (Signed)
Pharmacy Patient Advocate Encounter  Received notification from [facility] that Prior Authorization for EUCRISA 2% OINTMENT has been CANCELLED due to patient being eligible for Medicare.   Per insurance: This member is eligible for Medicare benefits. Please discuss this alternative pharmacy benefit coverage with your patient and submit your request to their Medicare Plan. If the member feels that this is in error, please have the member call Member Services at [phone].  PA #/Case ID/Reference #: [id number]  I didn't find any medicare coverage for patient. Pt will need to reach out to medicaid at the number above.

## 2023-06-27 ENCOUNTER — Encounter: Payer: Self-pay | Admitting: Student

## 2023-06-27 ENCOUNTER — Other Ambulatory Visit: Payer: Self-pay | Admitting: Student

## 2023-06-27 NOTE — Telephone Encounter (Signed)
Notified patient via mychart

## 2023-07-01 ENCOUNTER — Other Ambulatory Visit: Payer: Self-pay | Admitting: Student

## 2023-07-11 ENCOUNTER — Other Ambulatory Visit: Payer: Self-pay | Admitting: Student

## 2023-07-31 ENCOUNTER — Telehealth: Payer: Self-pay | Admitting: Student

## 2023-07-31 ENCOUNTER — Ambulatory Visit: Payer: MEDICAID | Admitting: Family Medicine

## 2023-07-31 NOTE — Telephone Encounter (Signed)
Reviewed form and placed in PCP's box for completion.  Glennie Hawk, CMA

## 2023-07-31 NOTE — Telephone Encounter (Signed)
Patient/guardian dropped off form at front desk for [place]-2.  Verified that patient section of form has been completed.  Last DOS/WCC with PCP was 05/14/23.  Placed form in green team folder to be completed by clinical staff.  Vilinda Blanks

## 2023-08-05 NOTE — Telephone Encounter (Signed)
Patient's mother called, LVM and informed that forms are ready for pick up. Copy made and placed in batch scanning. Original placed at front desk for pick up.   Jahmel Flannagan C Senie Lanese, RN  

## 2023-08-12 ENCOUNTER — Encounter: Payer: Self-pay | Admitting: Student

## 2023-08-12 DIAGNOSIS — L219 Seborrheic dermatitis, unspecified: Secondary | ICD-10-CM

## 2023-08-12 DIAGNOSIS — L732 Hidradenitis suppurativa: Secondary | ICD-10-CM

## 2023-08-13 MED ORDER — KETOCONAZOLE 2 % EX SHAM
MEDICATED_SHAMPOO | CUTANEOUS | 0 refills | Status: DC
Start: 2023-08-13 — End: 2023-10-16

## 2023-08-13 MED ORDER — BENZOYL PEROXIDE 10 % EX LIQD
CUTANEOUS | 0 refills | Status: AC
Start: 2023-08-13 — End: ?

## 2023-09-11 ENCOUNTER — Emergency Department (HOSPITAL_BASED_OUTPATIENT_CLINIC_OR_DEPARTMENT_OTHER): Payer: MEDICAID

## 2023-09-11 ENCOUNTER — Other Ambulatory Visit: Payer: Self-pay

## 2023-09-11 ENCOUNTER — Encounter (HOSPITAL_BASED_OUTPATIENT_CLINIC_OR_DEPARTMENT_OTHER): Payer: Self-pay | Admitting: Emergency Medicine

## 2023-09-11 ENCOUNTER — Emergency Department (HOSPITAL_BASED_OUTPATIENT_CLINIC_OR_DEPARTMENT_OTHER)
Admission: EM | Admit: 2023-09-11 | Discharge: 2023-09-11 | Disposition: A | Discharge status: Home / Self Care | Payer: MEDICAID | Attending: Emergency Medicine | Admitting: Emergency Medicine

## 2023-09-11 DIAGNOSIS — W133XXA Fall through floor, initial encounter: Secondary | ICD-10-CM | POA: Diagnosis not present

## 2023-09-11 DIAGNOSIS — S60945A Unspecified superficial injury of left ring finger, initial encounter: Secondary | ICD-10-CM | POA: Diagnosis present

## 2023-09-11 DIAGNOSIS — S61215A Laceration without foreign body of left ring finger without damage to nail, initial encounter: Secondary | ICD-10-CM | POA: Diagnosis not present

## 2023-09-11 DIAGNOSIS — Z7982 Long term (current) use of aspirin: Secondary | ICD-10-CM | POA: Diagnosis not present

## 2023-09-11 NOTE — ED Triage Notes (Signed)
Left finger laceration yesterday around 1 pm . Presents with band aid , bleeding controlled . Unknown last tetanus .

## 2023-09-11 NOTE — Discharge Instructions (Signed)
Keep wound clean and dry. Recheck with your doctor for any concerns.

## 2023-09-11 NOTE — ED Provider Notes (Signed)
EMERGENCY DEPARTMENT AT [facility] Provider Note   CSN: [id number] Arrival date & time: 09/11/23  1017     History  Chief Complaint  Patient presents with   finger laceration     Left ring fing     [name] is a 37 y.o. male.  37 year old male brought in by mom and support staff for laceration to the left 4th finger which occurred yesterday when the patient slipped on the wet floor of a restaurant and hit his hand on the floor. No other injuries. Bandaid applied but wound continues to bleed. Patient is on daily ASA.  Last tdap 05/06/2019 (on file)       Home Medications Prior to Admission medications   Medication Sig Start Date End Date Taking? Authorizing Provider  aspirin EC (ASPIRIN ADULT LOW STRENGTH) 81 MG tablet Take 1 tablet (81 mg total) by mouth every morning. 05/14/23   [name], DO  benzoyl peroxide 10 % LIQD [facility] UNDER ARMS ONCE DAILY 08/13/23   [name], DO  Crisaborole (EUCRISA) 2 % OINT Apply 1 application  topically 2 (two) times daily as needed. 05/14/23   [name], DO  fluticasone (FLONASE) 50 MCG/ACT nasal spray Place 2 sprays into both nostrils daily. 05/14/23   [name], DO  ketoconazole (NIZORAL) 2 % shampoo APPLY TO AFFECTED AREA TWICE A WEEK ON SUNDAY AND WEDNESDAY 08/13/23   [name], DO  levocetirizine (XYZAL) 5 MG tablet Take 1 tablet (5 mg total) by mouth at bedtime. 05/14/23   [name], DO  LORazepam (ATIVAN) 0.5 MG tablet Take 1 tablet (0.5 mg total) by mouth once as needed for up to 1 dose for anxiety (May take another 30 min after if unsatisfactory response for flight anxiety). 05/16/23   [name], DO  montelukast (SINGULAIR) 10 MG tablet Take 1 tablet (10 mg total) by mouth at bedtime. 05/14/23   [name], DO      Allergies    Sulfa antibiotics    Review of Systems   Review of Systems Level 5 caveat for poor historian  Physical Exam Updated Vital Signs BP 122/88   Pulse 73    Temp (!) 97.5 F (36.4 C)   Resp 16   SpO2 100%  Physical Exam Vitals and nursing note reviewed.  Constitutional:      General: He is not in acute distress.    Appearance: He is well-developed. He is not diaphoretic.  HENT:     Head: Normocephalic and atraumatic.  Pulmonary:     Effort: Pulmonary effort is normal.  Musculoskeletal:     Comments: Laceration to the distal phalanx, palmar aspect of the left 4th phalanx   Skin:    General: Skin is warm and dry.  Neurological:     Mental Status: He is alert and oriented to person, place, and time.  Psychiatric:        Behavior: Behavior normal.     ED Results / Procedures / Treatments   Labs (all labs ordered are listed, but only abnormal results are displayed) Labs Reviewed - No data to display  EKG None  Radiology DG Hand Complete Left  Result Date: 09/11/2023 CLINICAL DATA:  Fall, laceration pain EXAM: LEFT HAND - COMPLETE 3+ VIEW COMPARISON:  None Available. FINDINGS: There is no evidence of fracture or dislocation. There is no evidence of arthropathy or other focal bone abnormality. Soft tissues are unremarkable. IMPRESSION: Negative. Electronically Signed   By: Judie Petit.  Shick M.D.   On: 09/11/2023 12:24    Procedures Procedures    Medications Ordered in ED Medications - No data to display  ED Course/ Medical Decision Making/ A&P                                 Medical Decision Making  37 year old brought in by mom with concern for laceration to the left fourth finger as detailed above.  Found to have a superficial wound to the palmar aspect of the fourth distal phalanx not involving the nail.  Area was initially bleeding however after soaking wound in diluted Betadine solution to clean it, there is no further bleeding.  Wound occurred nearly 24 hours ago, does not need closure.  Gelfoam was applied to the area as well as Coban for dressing.  Discussed wound care with mom and patient.  Patient is discharged to recheck  with PCP as needed.  Tetanus is up-to-date.  X-ray of the left hand as ordered by self is negative for acute bony injury or foreign body, agree with radiology termination.        Final Clinical Impression(s) / ED Diagnoses Final diagnoses:  Laceration of left ring finger without foreign body without damage to nail, initial encounter    Rx / DC Orders ED Discharge Orders     None         Jeannie Fend, PA-C 09/11/23 1252    Rolan Bucco, MD 09/11/23 1413

## 2023-09-17 ENCOUNTER — Other Ambulatory Visit: Payer: Self-pay

## 2023-10-16 ENCOUNTER — Other Ambulatory Visit: Payer: Self-pay | Admitting: Student

## 2023-10-16 DIAGNOSIS — L219 Seborrheic dermatitis, unspecified: Secondary | ICD-10-CM

## 2023-11-15 ENCOUNTER — Other Ambulatory Visit: Payer: Self-pay

## 2023-11-15 DIAGNOSIS — J302 Other seasonal allergic rhinitis: Secondary | ICD-10-CM

## 2023-11-15 MED ORDER — MONTELUKAST SODIUM 10 MG PO TABS: 10.0000 mg | ORAL_TABLET | Freq: Every day | ORAL | 11 refills | Status: DC

## 2023-11-21 ENCOUNTER — Telehealth: Payer: Self-pay

## 2023-11-21 DIAGNOSIS — J309 Allergic rhinitis, unspecified: Secondary | ICD-10-CM

## 2023-11-21 DIAGNOSIS — Q221 Congenital pulmonary valve stenosis: Secondary | ICD-10-CM

## 2023-11-21 DIAGNOSIS — J302 Other seasonal allergic rhinitis: Secondary | ICD-10-CM

## 2023-11-21 DIAGNOSIS — L219 Seborrheic dermatitis, unspecified: Secondary | ICD-10-CM

## 2023-11-21 MED ORDER — ASPIRIN 81 MG PO TBEC: 81.0000 mg | DELAYED_RELEASE_TABLET | Freq: Every morning | ORAL | 11 refills | Status: DC

## 2023-11-21 MED ORDER — LEVOCETIRIZINE DIHYDROCHLORIDE 5 MG PO TABS: 5.0000 mg | ORAL_TABLET | Freq: Every day | ORAL | 11 refills | Status: DC

## 2023-11-21 MED ORDER — FLUTICASONE PROPIONATE 50 MCG/ACT NA SUSP: 2.0000 | Freq: Every day | NASAL | 3 refills | Status: AC

## 2023-11-21 MED ORDER — MONTELUKAST SODIUM 10 MG PO TABS: 10.0000 mg | ORAL_TABLET | Freq: Every day | ORAL | 11 refills | Status: DC

## 2023-11-21 MED ORDER — KETOCONAZOLE 2 % EX SHAM: MEDICATED_SHAMPOO | CUTANEOUS | 0 refills | Status: DC

## 2023-11-21 NOTE — Telephone Encounter (Signed)
Done

## 2023-11-21 NOTE — Telephone Encounter (Signed)
Patients mother calls nurse line in regards to medications.  She reports she went to refill pended medications, however PCP is no longer enrolled in [place].   Will forward to afternoon preceptor.

## 2023-11-27 ENCOUNTER — Ambulatory Visit (HOSPITAL_COMMUNITY)
Admission: RE | Admit: 2023-11-27 | Discharge: 2023-11-27 | Disposition: A | Discharge status: Home / Self Care | Payer: MEDICAID | Source: Ambulatory Visit | Attending: Cardiology | Admitting: Cardiology

## 2023-11-27 ENCOUNTER — Encounter (HOSPITAL_COMMUNITY): Payer: Self-pay | Admitting: Cardiology

## 2023-11-27 VITALS — BP 120/80 | HR 84 | Wt 226.8 lb

## 2023-11-27 DIAGNOSIS — I281 Aneurysm of pulmonary artery: Secondary | ICD-10-CM | POA: Insufficient documentation

## 2023-11-27 DIAGNOSIS — R011 Cardiac murmur, unspecified: Secondary | ICD-10-CM | POA: Diagnosis present

## 2023-11-27 LAB — LIPID PANEL
Cholesterol: 178 mg/dL (ref 0–200)
HDL: 74 mg/dL (ref >40)
LDL Cholesterol: 94 mg/dL (ref 0–99)
Total CHOL/HDL Ratio: 2.4 {ratio}
Triglycerides: 52 mg/dL (ref <150)
VLDL: 10 mg/dL (ref 0–40)

## 2023-11-27 LAB — BRAIN NATRIURETIC PEPTIDE: B Natriuretic Peptide: 5 pg/mL (ref 0.0–100.0)

## 2023-11-27 NOTE — Patient Instructions (Addendum)
Good to see you today!  No medication changes were made.   Scan of chest has been ordered, once insurance approved department will call to schedule an appointment  Labs done today, your results will be available in MyChart, we will contact you for abnormal readings.  Your physician recommends that you schedule a follow-up appointment in: 1 yr(jan 2026) call in November to schedule an appointment   If you have any questions or concerns before your next appointment please send [place] a message through [place] or call our office at [phone].    TO LEAVE A MESSAGE FOR THE NURSE SELECT OPTION 2, PLEASE LEAVE A MESSAGE INCLUDING: YOUR NAME DATE OF BIRTH CALL BACK NUMBER REASON FOR CALL**this is important as we prioritize the call backs  YOU WILL RECEIVE A CALL BACK THE SAME DAY AS LONG AS YOU CALL BEFORE 4:00 PM  At the [facility], you and your health needs are our priority. As part of our continuing mission to provide you with exceptional heart care, we have created designated Provider Care Teams. These Care Teams include your primary Cardiologist (physician) and Advanced Practice Providers (APPs- Physician Assistants and Nurse Practitioners) who all work together to provide you with the care you need, when you need it.   You may see any of the following providers on your designated Care Team at your next follow up: Dr Arvilla Meres Dr Marca Ancona Dr. Dorthula Nettles Dr. Clearnce Hasten Amy Filbert Schilder, NP Robbie Lis, [place] [facility] [place], [place] Brynda Peon, NP [place] Lee, NP Karle Plumber, PharmD   Please be sure to bring in all your medications bottles to every appointment.    Thank you for choosing [facility]

## 2023-11-27 NOTE — Progress Notes (Signed)
Patient ID: [name], male   DOB: 24-Dec-1985, 38 y.o.   MRN: [id number] PCP: Dr. Nadine Salazar Cardiology: Dr. 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  Patient was followed in the past by a pediatric cardiologist then by Dr. Reyes Salazar at [facility].  He had not had cardiology followup for several years prior to his initial appointment with me.  Echo in 2005 showed normal LV EF but questioned noncompaction towards the apex, mild pulmonic stenosis, small secundum ASD, and normal tricuspid valve.  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  In 10/13,  I had him do an echo (difficult study).  LV EF was normal, there appeared to be mild PS with mean gradient 18 mmHg, the main PA was moderately dilated.  The atrial septum and RV were not well-visualized.  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  Given the ASD and RV dilation, TEE was done.  This confirmed at 1.5 cm secundum ASD with left to right flow and RV dilation.  He had percutaneous ASD closure in 1/14 due to enlarge RV.  This was successful.  Cath prior to closure showed Qp/Qs 1.9/1.  Echo after procedure in 2/14 showed EF 50-55%, mild MR, intact ASD closure device, mild PS with peak gradient 17 mmHg.  MRA chest in 11/16 showed stable 4.6 cm main PA.  Most recent echo in 11/17 showed EF 55-60% with prominent apical trabeculations, normal RV, ASD closure device well-seated, and PV gradient peak 9 mmHg.   Patient is relatively high-functioning for autism.  He reads and writes and has a relatively good memory per his mother.  He has trouble with social interactions.  His mother helps with history. Main issue has been anxiety.  His mother says that  he paces a lot around the house, getting up to 20,000 steps/day in.  No exertional dyspnea, no chest pain.  No lightheadedness or dizziness.  No palpitations.   ECG (personally reviewed): NSR, LAFB  PMH: 1. Chromosome 8p deletion syndrome: Autism + cardiac anomalies.  He has pulmonic stenosis and a secundum ASD.  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  He is followed by a geneticist at [facility].  2. Congenital heart disease: Echo in 2005 showed EF 55-60% with ? 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  Echo (10/13) was a difficult study with EF 60-65%, mild MR, mild PS peak gradient 18 mmHg, dilated main PA.  Cardiac MRI (10/13) with LV EF 55%, prominent trabeculations LV apex suggestive of noncompaction, minimal PI but suspect mild PS, moderate RV dilation with EF 45% (RV EDVi 155 ml/m2), main PA dilated to 4.6 x 4.4 cm, secundum ASD, hard to tell size on MRI, Qp/Qs by MRI was 1.4/1.  TEE (12/13): EF 55%, LV noncompaction, RV mild-moderately dilated with normal systolic function, 1.5 cm secundum ASD with left to right flow, no significant PI, unable to measure PV gradient but does not look like significant PS.  Echo after ASD  closure in (2/14) with  EF 50-55%, mild MR, intact ASD closure device, mild PS with peak gradient 17 mmHg. Summary: Mild PS.  Secundum ASD with dilated RV.  ASD was closed percutaneously on 1/14.  Qp/Qs on cath prior to procedure was 1.9/1.  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  MRA chest in 10/14 showed stable 4.6 cm main PA dilation.  - Echo (11/17) with EF 55-60% with prominent apical trabeculations, normal RV size and systolic function, well-seated ASD closure device with no evident leak, h/o PS with peak gradient 9 mmHg.  - MRA chest  (11/16) with 4.5-4.6 cm main PA.  - MRA chest (11/20): main PA 4.3 cm 3. Autism with moderate mental retardation.  4. Allergic rhinitis  SH: Lives with mother. Nonsmoker, no drugs.  FH: Mother with heart murmur.   ROS: All systems reviewed and negative except as per HPI.   Current Outpatient Medications  Medication Sig Dispense Refill   aspirin EC (ASPIRIN ADULT LOW STRENGTH) 81 MG tablet Take 1 tablet (81 mg total) by mouth every morning. 30 tablet 11   benzoyl peroxide 10 % LIQD WASH UNDER ARMS ONCE DAILY 237 g 0   Crisaborole (EUCRISA) 2 % OINT Apply 1 application  topically 2 (two) times daily as needed. 60 g 5   fluticasone (FLONASE) 50 MCG/ACT nasal spray Place 2 sprays into both nostrils daily. 16 g 3   ketoconazole (NIZORAL) 2 % shampoo APPLY TO TO THE AFFECTED AREA 2 TIMES A WEEK ON SUNDAY AND WEDNESDAY 120 mL 0   levocetirizine (XYZAL) 5 MG tablet Take 1 tablet (5 mg total) by mouth at bedtime. 30 tablet 11   montelukast (SINGULAIR) 10 MG tablet Take 1 tablet (10 mg total) by mouth at bedtime. 30 tablet 11   No current facility-administered medications for this encounter.    BP 120/80   Pulse 84   Wt 102.9 kg (226 lb 12.8 oz)   SpO2 96%   BMI 29.92 kg/m  General: NAD Neck: No JVD, no thyromegaly or thyroid nodule.  Lungs: Clear to auscultation bilaterally with normal respiratory effort. CV: Nondisplaced PMI.  Heart regular S1/S2, no S3/S4, 1/6 SEM LUSB.  No peripheral edema.  No carotid bruit.  Normal pedal pulses.  Abdomen: Soft, nontender, no hepatosplenomegaly, no distention.  Skin: Intact without lesions or rashes.  Neurologic: Alert and oriented x 3.  Psych: Normal affect. Extremities: No clubbing or cyanosis.  HEENT: Normal.   Assessment/Plan: 1. Secundum ASD: Given enlarged RV, this was closed percutaneously in 1/14.  Last echo in 11/17 showed ASD closure device in place with no visible residual shunt.  Continue ASA 81 long-term.   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  LV EF has been preserved.   - I will arrange for echo.  3. 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  This can be seen with pulmonic stenosis (PS has appeared to be mild in this patient).  High flow from prior ASD may be a major contributor as well.  It is possible that with the ASD closed, the PA will not enlarge further or potentially even decrease in size given decrease in pulmonary flow.   - Will repeat MRA chest to follow the PA.   4. Pulmonic stenosis: Mild by last echo.  No significant pulmonic insufficiency was visualized.  He has a soft murmur.  - Arrange for echo as above.   Followup 1 year.  Marca Ancona 03/27/2014

## 2023-12-10 ENCOUNTER — Telehealth: Payer: Self-pay

## 2024-01-02 ENCOUNTER — Ambulatory Visit (HOSPITAL_COMMUNITY)
Admission: RE | Admit: 2024-01-02 | Discharge: 2024-01-02 | Disposition: A | Discharge status: Home / Self Care | Payer: MEDICAID | Source: Ambulatory Visit | Attending: Cardiology | Admitting: Cardiology

## 2024-01-02 DIAGNOSIS — Q212 Atrioventricular septal defect, unspecified as to partial or complete: Secondary | ICD-10-CM | POA: Insufficient documentation

## 2024-01-02 DIAGNOSIS — Z9889 Other specified postprocedural states: Secondary | ICD-10-CM | POA: Diagnosis not present

## 2024-01-02 DIAGNOSIS — I501 Left ventricular failure: Secondary | ICD-10-CM | POA: Insufficient documentation

## 2024-01-02 DIAGNOSIS — I281 Aneurysm of pulmonary artery: Secondary | ICD-10-CM | POA: Diagnosis present

## 2024-01-02 LAB — ECHOCARDIOGRAM COMPLETE
AR max vel: 2.21 cm2
AV Area VTI: 2.6 cm2
AV Area mean vel: 2.25 cm2
AV Mean grad: 3 mmHg
AV Peak grad: 7.2 mmHg
Ao pk vel: 1.34 m/s
Area-P 1/2: 3.1 cm2
Calc EF: 59.8 %
S' Lateral: 3.2 cm
Single Plane A2C EF: 60.9 %
Single Plane A4C EF: 62.1 %

## 2024-01-02 NOTE — Progress Notes (Signed)
  Echocardiogram 2D Echocardiogram has been performed.  Ocie Doyne RDCS 01/02/2024, 11:42 AM

## 2024-01-06 ENCOUNTER — Other Ambulatory Visit: Payer: Self-pay | Admitting: Family Medicine

## 2024-01-06 DIAGNOSIS — L219 Seborrheic dermatitis, unspecified: Secondary | ICD-10-CM

## 2024-04-14 ENCOUNTER — Encounter: Payer: Self-pay | Admitting: *Deleted

## 2024-05-28 ENCOUNTER — Ambulatory Visit: Payer: MEDICAID | Admitting: Family Medicine

## 2024-05-28 VITALS — BP 124/81 | Wt 229.4 lb

## 2024-05-28 DIAGNOSIS — L301 Dyshidrosis [pompholyx]: Secondary | ICD-10-CM | POA: Diagnosis not present

## 2024-05-28 DIAGNOSIS — Z23 Encounter for immunization: Secondary | ICD-10-CM | POA: Diagnosis not present

## 2024-05-28 DIAGNOSIS — F5101 Primary insomnia: Secondary | ICD-10-CM | POA: Diagnosis not present

## 2024-05-28 MED ORDER — BETAMETHASONE DIPROPIONATE 0.05 % EX OINT: TOPICAL_OINTMENT | Freq: Two times a day (BID) | CUTANEOUS | 0 refills | Status: DC

## 2024-05-28 NOTE — Assessment & Plan Note (Signed)
 Uncontrolled with emollients alone. Will send in betamethasone  ointment to be used BID for 14 days at a time as needed. Advised to continue emollients with gloves at bedtime. Follow up as needed.

## 2024-05-28 NOTE — Progress Notes (Addendum)
    SUBJECTIVE:   CHIEF COMPLAINT / HPI:   Hand lesions On the palms. At times, these areas bleed and crack. He has been using Eucerin cream and lotion to help with this. Has been told its psoriasis vs eczema.  Seborrheic dermatitis of the scalp Previously used ketoconazole  shampoo. Now only uses Selsun blue that has controlled the symptoms.  Sleep issues Has been worsening as he has gotten older. Used to sleep at 10 PM. Now sleeps at any time, but when he wakes up, he is up. He sometimes gets up at 3 AM and all hours of the night walking around and watching TV. Used to try ambien without much relief. Has never tried melatonin.  PERTINENT  PMH / PSH: boils under arms (uses benzoyl peroxide ), ASD with mesh (ASA taken daily), ADHD  OBJECTIVE:   BP 124/81   Wt 229 lb 6.4 oz (104.1 kg)   SpO2 100%   BMI 30.27 kg/m   General: Alert and oriented, in NAD Skin: Warm, dry, and intact; mild scaling over bilateral palms with cracking/erythema over bilateral thenar eminence HEENT: NCAT, EOM grossly normal, midline nasal septum Cardiac: RRR, no m/r/g appreciated Respiratory: CTAB, breathing and speaking comfortably on RA Abdominal: Soft, nontender, nondistended, normoactive bowel sounds Extremities: Moves all extremities grossly equally Neurological: No gross focal deficit Psychiatric: Appropriate mood and affect     ASSESSMENT/PLAN:   Assessment & Plan Dyshidrotic eczema Uncontrolled with emollients alone. Will send in betamethasone  ointment to be used BID for 14 days at a time as needed. Advised to continue emollients with gloves at bedtime. Follow up as needed. Primary insomnia Advised to use melatonin to help. Intellectual delay likely playing a role. Also discussed sleep hygiene. Follow up as needed.   Pneumococcal vaccination given today given patient's demographics and history of ASD with pulmonic stenosis and left ventricular noncompaction.  FL2 form completed and will need  to be picked up by caregiver.  Stuart Redo, MD [facility]

## 2024-05-28 NOTE — Patient Instructions (Signed)
 I have sent in steroid ointment. Apply this to the hands twice a day for 14 days. Do not apply for longer.  Try melatonin 30 minutes before sleep.  You received the pneumonia vaccine today given your chronic heart condition.  Continue to use Selsun blue for the dandruff.  Come back in 3 months or sooner if needed.

## 2024-05-29 ENCOUNTER — Telehealth: Payer: Self-pay

## 2024-05-29 NOTE — Telephone Encounter (Signed)
 Form place up front for pick up.   Copy made for batch scanning.   Mother has been made aware.

## 2024-05-29 NOTE — Telephone Encounter (Signed)
-----   Message from [facility] sent at 05/28/2024  5:16 PM EDT ----- Hi! I have this patient's FL2 form completed. Please let caregivers know that it is ready for pickup. Thank you!!

## 2024-05-29 NOTE — Telephone Encounter (Signed)
 Pharmacy Patient Advocate Encounter   Received notification from CoverMyMeds that prior authorization for Betamethasone  Dipropionate 0.05% ointment is required/requested.   Insurance verification completed.   The patient is insured through [facility] .   PA required; PA submitted to above mentioned insurance via CoverMyMeds Key/confirmation #/EOC [id number]. Status is pending

## 2024-06-01 MED ORDER — BETAMETHASONE VALERATE 0.1 % EX OINT: 1.0000 | TOPICAL_OINTMENT | Freq: Two times a day (BID) | CUTANEOUS | 0 refills | Status: DC

## 2024-06-01 NOTE — Telephone Encounter (Signed)
 Betamethasone  switched from dipropionate to valerate formulation due to insurance preference.

## 2024-06-01 NOTE — Telephone Encounter (Signed)
 Pharmacy Patient Advocate Encounter  Received notification from [facility] that Prior Authorization for Betamethasone  Dipropionate 0.05% ointment has been DENIED.  Full denial letter will be uploaded to the media tab. See denial reason below.    PA #/Case ID/Reference #: [id number]

## 2024-06-01 NOTE — Addendum Note (Signed)
 Addended by: THARON LUNG B on: 06/01/2024 01:37 PM   Modules accepted: Orders

## 2024-11-11 ENCOUNTER — Encounter: Payer: Self-pay | Admitting: Family Medicine

## 2024-11-11 ENCOUNTER — Ambulatory Visit (INDEPENDENT_AMBULATORY_CARE_PROVIDER_SITE_OTHER): Payer: MEDICAID | Admitting: Family Medicine

## 2024-11-11 ENCOUNTER — Ambulatory Visit: Payer: Self-pay | Admitting: Family Medicine

## 2024-11-11 VITALS — BP 126/88 | HR 72 | Temp 98.6°F | Wt 234.6 lb

## 2024-11-11 DIAGNOSIS — H6123 Impacted cerumen, bilateral: Secondary | ICD-10-CM | POA: Diagnosis not present

## 2024-11-11 DIAGNOSIS — R0981 Nasal congestion: Secondary | ICD-10-CM | POA: Diagnosis not present

## 2024-11-11 DIAGNOSIS — L301 Dyshidrosis [pompholyx]: Secondary | ICD-10-CM | POA: Diagnosis not present

## 2024-11-11 LAB — POC SOFIA 2 FLU + SARS ANTIGEN FIA
Influenza A, POC: NEGATIVE
Influenza B, POC: NEGATIVE
SARS Coronavirus 2 Ag: NEGATIVE

## 2024-11-11 MED ORDER — BENZONATATE 100 MG PO CAPS: 100.0000 mg | ORAL_CAPSULE | Freq: Two times a day (BID) | ORAL | 0 refills | Status: AC | PRN

## 2024-11-11 MED ORDER — BETAMETHASONE VALERATE 0.1 % EX OINT: 1.0000 | TOPICAL_OINTMENT | Freq: Two times a day (BID) | CUTANEOUS | 3 refills | Status: AC

## 2024-11-11 NOTE — Assessment & Plan Note (Signed)
 Current flare-up with dry skin and discomfort. - Prescribed steroid cream for two weeks on, two weeks off. - Can also consider 2 week induction with use every 3 days thereafter if better control needed.

## 2024-11-11 NOTE — Progress Notes (Signed)
" ° °  SUBJECTIVE:   CHIEF COMPLAINT / HPI:  Discussed the use of AI scribe software for clinical note transcription with the patient, who gave verbal consent to proceed.  History of Present Illness [name] is a 39 year old male who presents with congestion, cough, and nasal symptoms.  Upper respiratory symptoms - Significant nasal congestion and cough for the past week - Used a full bottle of Mucinex  with only slight improvement - Several days of nosebleeds, sneezing, and fatigue - Increased difficulty breathing due to congestion - No fever or facial pain - Nose feels very stuffed - Considering nasal saline for additional relief  Allergic rhinitis - Takes Flonase , Xyzal , and Singulair  daily without missed doses - Episode feels different from usual allergies  Ear symptoms - No ear pain or hearing changes - Uses Debrox drops at home for ear wax  Dermatologic symptoms - Arthritic eczema treated with steroid cream - Steroid cream prescription helped; needs more  OBJECTIVE:  BP 126/88   Pulse 72   Temp 98.6 F (37 C) (Oral)   Wt 234 lb 9.6 oz (106.4 kg)   SpO2 98%   BMI 30.95 kg/m   Physical Exam GENERAL: Alert, cooperative, well developed, no acute distress. HEENT: Normocephalic, normal visualized oropharynx though difficult to best ascertain given intellectual delay, moist mucous membranes. Nasal mucosa mildly swollen with mucus. Right ear with cerumen impaction, left ear visualized portion of TM clear also with cerumen. NECK: Neck examined, no LAD or tenderness noted. CHEST: Clear to auscultation bilaterally. No wheezes, rhonchi, or crackles. CARDIOVASCULAR: Normal heart rate and rhythm. S1 and S2 normal without murmurs. ABDOMEN: Soft, non-distended, normal bowel sounds. EXTREMITIES/SKIN: No cyanosis or edema, eczematous plaques overlying bilateral hands without current drainage/bleeding, no rashes NEUROLOGICAL: Cranial nerves grossly intact. Moves all  extremities without gross motor or sensory deficit.  ASSESSMENT/PLAN:   Assessment & Plan Nasal congestion Symptoms suggest viral etiology. No sign of pneumonia at this time. Flu/COVID swab negative. Advised against flu vaccination until recovery. - Continue Tylenol  for symptom relief. - Continue Mucinex  as needed. - Prescribed Tessalon  Perles for cough suppression; counseled on keeping away from children and avoiding crushing. - Use nasal saline or neti pot for nasal congestion. - Monitor symptoms over next 72 hours and return to care if not improving. Dyshidrotic eczema Current flare-up with dry skin and discomfort. - Prescribed steroid cream for two weeks on, two weeks off. - Can also consider 2 week induction with use every 3 days thereafter if better control needed. Bilateral impacted cerumen Without pain or hearing issues. - Use Debrox drops to soften and remove earwax.  Stuart Redo, MD [facility] "

## 2024-11-11 NOTE — Patient Instructions (Signed)
 VISIT SUMMARY: Today, you were seen for congestion, cough, and nasal symptoms that have been ongoing for a week. We discussed your upper respiratory symptoms, allergic rhinitis, ear symptoms, and eczema.  YOUR PLAN: ACUTE UPPER RESPIRATORY INFECTION: You have symptoms of a viral upper respiratory infection, including nasal congestion and cough. -Continue taking Tylenol  for symptom relief. -Continue using Mucinex  as needed. -I have prescribed Tessalon  Perles to help suppress your cough. -Use nasal saline or a neti pot to help with nasal congestion. -Monitor your symptoms and if there is no improvement by day 10, we may consider antibiotics for sinusitis.  ECZEMA OF HANDS: You have a flare-up of eczema on your hands, causing dry skin and discomfort. -I have prescribed a steroid cream to use for two weeks on, then two weeks off.  CERUMEN IMPACTION, RIGHT EAR: You have a significant buildup of earwax in your right ear, but no pain or hearing issues. -Use Debrox drops to soften and remove the earwax.  Please let me know if you have any other questions.  Dr. Tharon

## 2024-11-12 ENCOUNTER — Encounter: Payer: Self-pay | Admitting: Family Medicine

## 2024-11-23 ENCOUNTER — Other Ambulatory Visit: Payer: Self-pay | Admitting: Family Medicine

## 2024-11-23 DIAGNOSIS — J302 Other seasonal allergic rhinitis: Secondary | ICD-10-CM

## 2024-11-23 DIAGNOSIS — Q221 Congenital pulmonary valve stenosis: Secondary | ICD-10-CM

## 2024-12-04 ENCOUNTER — Encounter: Payer: Self-pay | Admitting: Family Medicine

## 2024-12-04 ENCOUNTER — Ambulatory Visit: Payer: Self-pay | Admitting: Family Medicine

## 2024-12-04 VITALS — BP 119/82 | HR 73 | Ht 73.0 in | Wt 231.8 lb

## 2024-12-04 DIAGNOSIS — L301 Dyshidrosis [pompholyx]: Secondary | ICD-10-CM

## 2024-12-04 DIAGNOSIS — E669 Obesity, unspecified: Secondary | ICD-10-CM

## 2024-12-04 DIAGNOSIS — M722 Plantar fascial fibromatosis: Secondary | ICD-10-CM

## 2024-12-04 DIAGNOSIS — Z0000 Encounter for general adult medical examination without abnormal findings: Secondary | ICD-10-CM | POA: Diagnosis not present

## 2024-12-04 LAB — POCT GLYCOSYLATED HEMOGLOBIN (HGB A1C): Hemoglobin A1C: 5.6 % (ref 4.0–5.6)

## 2024-12-04 NOTE — Assessment & Plan Note (Signed)
 Severe dyshidrotic eczema with fissuring and itching. Previous betamethasone  provided partial relief, current treatment inadequate. - Initiated another two-week course of betamethasone  cream twice daily. - Continue betamethasone  cream twice daily for three days weekly as maintenance. - Apply Vaseline and wear gloves at night while severe. - Send MyChart message if no improvement in two weeks.

## 2024-12-04 NOTE — Progress Notes (Signed)
" ° °  SUBJECTIVE:   CHIEF COMPLAINT / HPI:  Discussed the use of AI scribe software for clinical note transcription with the patient, who gave verbal consent to proceed.  History of Present Illness [name] is a 39 year old male with autism who presents with right foot pain. He is accompanied by his caregiver.  Right heel pain - Intermittent right heel pain, worse in the morning and improves throughout the day - Pain does not occur every day - Limping present at times, which is unusual given typically high pain tolerance  Gait abnormality - Chronic toe-walking observed by caregiver - Caregiver feels toe-walking may contribute to foot pain - He has flatter feet  Dermatologic symptoms - Fissured, pruritic rash on hands - Previously used topical steroid on hands twice daily for 2 weeks - Rash improved but not fully controlled  Decreased physical activity and weight gain - Recent weight gain attributed to decreased walking and outdoor activity during cold, snowy weather - Previously very active with frequent walking and pacing   OBJECTIVE:  BP 119/82   Pulse 73   Ht 6' 1 (1.854 m)   Wt 231 lb 12.8 oz (105.1 kg)   SpO2 98%   BMI 30.58 kg/m   Physical Exam GENERAL: Alert, cooperative, well developed, no acute distress. EXTREMITIES/MSK: No cyanosis or edema. Tenderness on palpation of the right foot over plantar fascia origin, pain with heel squeeze, good dorsalis pulse in the right foot, sensation intact in the right foot, moderate flattening of the right foot arch on standing. NEUROLOGICAL: Cranial nerves grossly intact, moves all extremities without gross motor or sensory deficit. SKIN: Eczematous lesions over dorsal hands bilaterally with some fissuring; palms bilaterally very dry.  ASSESSMENT/PLAN:   Assessment & Plan Plantar fasciitis of right foot Right plantar heel and midfoot pain, likely due to plantar fascia inflammation. No bony abnormalities  appreciated. Flat foot may contribute. - Provided exercises for plantar fascia stretching. - Recommended over-the-counter orthotics for support. - Advised Voltaren gel application four times daily. - Consider Sports Medicine referral for custom orthotics and foot x-ray if no improvement in 2-3 weeks. Dyshidrotic eczema Severe dyshidrotic eczema with fissuring and itching. Previous betamethasone  provided partial relief, current treatment inadequate. - Initiated another two-week course of betamethasone  cream twice daily. - Continue betamethasone  cream twice daily for three days weekly as maintenance. - Apply Vaseline and wear gloves at night while severe. - Send MyChart message if no improvement in two weeks. Obesity (BMI 30-39.9) Repeated A1c today. Previously 5.6 and borderline prediabetes.  Stuart Redo, MD [facility]  "

## 2024-12-04 NOTE — Patient Instructions (Addendum)
 His symptoms suggest plantar fasciitis. You can try over the counter orthotics too. You can try voltaren gel on the foot 4 times per day. Be sure to perform these heel exercises. For the eczema, use the ointment again twice daily for 2 weeks and then go to twice daily 3 days a week. Please sleep with vaseline on the hands in gloves. Let me know if not improved (or the eczema on the hands) over the next 2 weeks.

## 2024-12-04 NOTE — Assessment & Plan Note (Signed)
 Repeated A1c today. Previously 5.6 and borderline prediabetes.

## 2024-12-07 ENCOUNTER — Other Ambulatory Visit: Payer: Self-pay | Admitting: Family Medicine

## 2024-12-07 DIAGNOSIS — J302 Other seasonal allergic rhinitis: Secondary | ICD-10-CM
